# Patient Record
Sex: Male | Born: 1970 | Race: White | Hispanic: No | Smoking: Former smoker
Health system: Southern US, Community
[De-identification: ages and names within clinical notes are randomized; demographics above are authoritative.]

## PROBLEM LIST (undated history)

## (undated) DIAGNOSIS — F32A Depression, unspecified: Secondary | ICD-10-CM

## (undated) DIAGNOSIS — K429 Umbilical hernia without obstruction or gangrene: Secondary | ICD-10-CM

## (undated) DIAGNOSIS — R011 Cardiac murmur, unspecified: Secondary | ICD-10-CM

## (undated) HISTORY — DX: Cardiac murmur, unspecified: R01.1

## (undated) HISTORY — DX: Depression, unspecified: F32.A

## (undated) HISTORY — PX: HYDROCELE EXCISION / REPAIR: SUR1145

---

## 2012-11-18 ENCOUNTER — Ambulatory Visit: Payer: Self-pay | Admitting: Urology

## 2012-12-11 ENCOUNTER — Ambulatory Visit: Payer: Self-pay | Admitting: Urology

## 2014-07-02 NOTE — Op Note (Signed)
PATIENT NAME:  Jermaine Ray, Jermaine Ray MR#:  [id number] DATE OF BIRTH:  December 20, 1970  DATE OF PROCEDURE:  12/11/2012  PREOPERATIVE DIAGNOSIS: Left scrotal hematocele versus abscess.   POSTOPERATIVE DIAGNOSIS: Left scrotal abscess.   PROCEDURE: Incision and drainage of left scrotal abscess.   SURGEON: Edrick Oh, MD.   ANESTHESIA: Laryngeal mask airway anesthesia.   INDICATIONS: The patient is a 45 year old gentleman, who underwent a redo left hydrocelectomy several weeks prior. He did well initially after the procedure. He developed slight scrotal swelling and increased discomfort approximately 1 week ago. This has progressively worsened to the point of significant pain and discomfort. There has been progression of the scrotal swelling with slight skin edema and no significant erythema. This began after returning to work with increased physical activity. The initial concern was the possibility of a hematocele. He did develop a fever to 103 with subsequent resolution and no recurrence. This was more concerning for possible abscess. Due to the continued pain and discomfort and worsening symptoms, we have elected to proceed with incision and drainage for further evaluation. He presents for this purpose.   DESCRIPTION OF PROCEDURE: After informed consent was obtained, the patient was taken to the operating room and placed in the supine position on the operating table under laryngeal mask airway anesthesia. The patient was then prepped and draped in the usual standard fashion. The inferior-most aspect of the previous midline incision was opened approximately 2 cm. As the skin was opened, a large amount of thick, dark gray and blood-tinged material extruded from the opening. It was noted to be foul-smelling. A large amount of purulent material was drained. Digital manipulation then of the hemiscrotum demonstrated no significant other abnormalities. There were no isolated pockets or significant septations. The  hemiscrotum was irrigated with ample amounts of iodine and saline solution. Visual examination demonstrated a normal appearing testicle with minimal fibrinous material within the remaining hemiscrotum. No other significant abnormalities were noted. There was no extension into the cord or inguinal region. This all appears to be isolated to the hemiscrotal compartment. The right hemiscrotum was essentially normal except for some mild skin edema. There was no evidence of any skin necrosis or involvement of the skin per se. The cavity was then packed with Betadine soaked Kling. ABD pads were placed as well as a scrotal support and fluffs. The patient was then awakened from laryngeal mask airway anesthesia and was taken to the recovery room in stable condition. There were no problems or complications. The patient tolerated the procedure well. Also of note, cultures were obtained at the initial opening. We will treat with appropriate antibiotics once the culture has returned. He is otherwise to continue his current antibiotic therapy.   ____________________________ Denice Bors. Jacqlyn Larsen, MD bsc:aw D: 12/12/2012 09:01:37 ET T: 12/12/2012 09:20:56 ET JOB#: [id number]  cc: Denice Bors. Jacqlyn Larsen, MD, <Dictator> Denice Bors Seve Monette MD ELECTRONICALLY SIGNED 12/15/2012 8:59

## 2014-07-02 NOTE — Op Note (Signed)
PATIENT NAME:  Jermaine Ray, SUPPLE MR#:  [id number] DATE OF BIRTH:  1970-09-10  DATE OF PROCEDURE:  11/18/2012  PRINCIPAL DIAGNOSIS:  Recurrent left hydrocele.   POSTOPERATIVE DIAGNOSIS:  Recurrent left hydrocele.   PROCEDURE:  Redo left hydrocelectomy.   SURGEON:  Dr. Edrick Oh  ANESTHESIA:  Laryngeal mask airway anesthesia.   INDICATION:  The patient is a 44 year old gentleman who underwent a bilateral hydrocele repair a number of years prior. He recently developed progressive enlargement of the left hemiscrotum. Ultrasound confirms recurrence of a hydrocele due to its size and progressive growth. He has elected to proceed with hydrocelectomy. He presents today for this purpose.   DESCRIPTION OF PROCEDURE:  After informed consent was obtained, the patient was taken to the operating room and placed in the supine position on the operating table under laryngeal mask airway anesthesia. The patient was then prepped and draped in the usual standard fashion. A midline scrotal incision was made approximately 5 cm in length. The dissection was continued down to expose the hydrocele sac. The overlying tissue was noted to be very adherent to the hydrocele sac, but with continued manipulation it was able to be freed anteriorly with continued manipulation. The hydrocele sac was able to be delivered from the hemiscrotum. Further inspection demonstrated extensive scar and adherent tissue surrounding the testicle. The testicle itself could not be visualized. On initial examination, the cord structures could not be identified. A thinner area on the anterior aspect of the hydrocele was then opened utilizing electrocautery. Once the fluid was drained, the inside of the hydrocele sac was inspected. Approximately 250 mL of dark yellow fluid was drained from the sac. The testicle could be seen posterior with the edges of the hydrocele sac visible. A plane was able to be developed more inferior separating the sac from the  surrounding tissue. This was extended circumferentially. There were areas more anterior that were very adherent; this limited dissection to some degree, however, it was in close proximity to the testicle proper. The vessels and vas deferens were subsequently identified. The dissection was taken down to this point. This left an approximate 2 cm segment of hydrocele sac adherent which could not be easily dissected Once the hydrocele sac was dissected free to the level of the testicle along the remaining circumference, the excessive sac was cut free utilizing electrocautery. Due to the adherence of the previous repair, the remaining tissue was unable to be everted posterior to the testicle. The tissue was secured to the testicle and surrounding tissue with interrupted 2-0 chromic sutures circumferentially to prevent reinversion. Once this was completed, the testicle was replaced back into the hemiscrotum. It was noted that the inferior aspect did partially recollapse. This was in the area that the extra tissue was present. The testicle was redelivered, additional adhesion was dissected free from the posterior aspect of the testicle more toward the cord proper. Once this was completed four 3-0 Vicryl sutures were utilized to secure the edges of the residual hydrocele sac posterior to the testicle. The testicle was then returned to the hemiscrotum. It was noted to reside within good position with no areas of collapse. A few small areas of venous ooze were noted on the inner hemiscrotal tissue. These were cauterized utilizing electrocautery. Once adequate hemostasis was obtained, the left hemiscrotum was closed utilizing running 2-0 chromic suture. The skin was closed utilizing interrupted 4-0 Monocryl mattress sutures. Collodion was then applied. The scrotal support with fluffs was then placed. The patient was then  awakened from laryngeal mask airway anesthesia, was taken to the recovery room in stable condition. There  were no problems or complications. The patient tolerated the procedure well. Estimated blood loss was minimal.  The hydrocele sac was sent to pathology for pathology evaluation.   ____________________________ Denice Bors Jacqlyn Larsen, MD bsc:ce D: 11/18/2012 12:49:05 ET T: 11/18/2012 13:30:27 ET JOB#: [id number]  cc: Denice Bors. Jacqlyn Larsen, MD, <Dictator> Denice Bors Alysse Rathe MD ELECTRONICALLY SIGNED 11/18/2012 17:40

## 2016-11-29 ENCOUNTER — Emergency Department
Admission: EM | Admit: 2016-11-29 | Discharge: 2016-11-29 | Disposition: A | Discharge status: Home / Self Care | Payer: Self-pay | Attending: Emergency Medicine | Admitting: Emergency Medicine

## 2016-11-29 ENCOUNTER — Encounter: Payer: Self-pay | Admitting: Emergency Medicine

## 2016-11-29 ENCOUNTER — Emergency Department: Payer: Self-pay

## 2016-11-29 DIAGNOSIS — F17290 Nicotine dependence, other tobacco product, uncomplicated: Secondary | ICD-10-CM | POA: Insufficient documentation

## 2016-11-29 DIAGNOSIS — R0789 Other chest pain: Secondary | ICD-10-CM | POA: Insufficient documentation

## 2016-11-29 LAB — BASIC METABOLIC PANEL
ANION GAP: 6 (ref 5–15)
BUN: 16 mg/dL (ref 6–20)
CHLORIDE: 102 mmol/L (ref 101–111)
CO2: 29 mmol/L (ref 22–32)
Calcium: 9.5 mg/dL (ref 8.9–10.3)
Creatinine, Ser: 1.13 mg/dL (ref 0.61–1.24)
GFR calc non Af Amer: >60 mL/min (ref >60)
GLUCOSE: 92 mg/dL (ref 65–99)
POTASSIUM: 4.3 mmol/L (ref 3.5–5.1)
Sodium: 137 mmol/L (ref 135–145)

## 2016-11-29 LAB — CBC
HEMATOCRIT: 40.7 % (ref 40.0–52.0)
HEMOGLOBIN: 13.9 g/dL (ref 13.0–18.0)
MCH: 31.4 pg (ref 26.0–34.0)
MCHC: 34.1 g/dL (ref 32.0–36.0)
MCV: 92.1 fL (ref 80.0–100.0)
Platelets: 257 10*3/uL (ref 150–440)
RBC: 4.42 MIL/uL (ref 4.40–5.90)
RDW: 13.2 % (ref 11.5–14.5)
WBC: 10.2 10*3/uL (ref 3.8–10.6)

## 2016-11-29 LAB — TROPONIN I

## 2016-11-29 IMAGING — CR DG CHEST 2V
1 series · 2 of 2 positions shown · non-contrast
Comparison: None.

CLINICAL DATA: Left-sided chest pain radiating to left arm for
several days.

EXAM:
CHEST  2 VIEW

[Series 1: dg chest 2 view · 0.14mm/px · 2 of 2 slices shown]
[im 1/2]
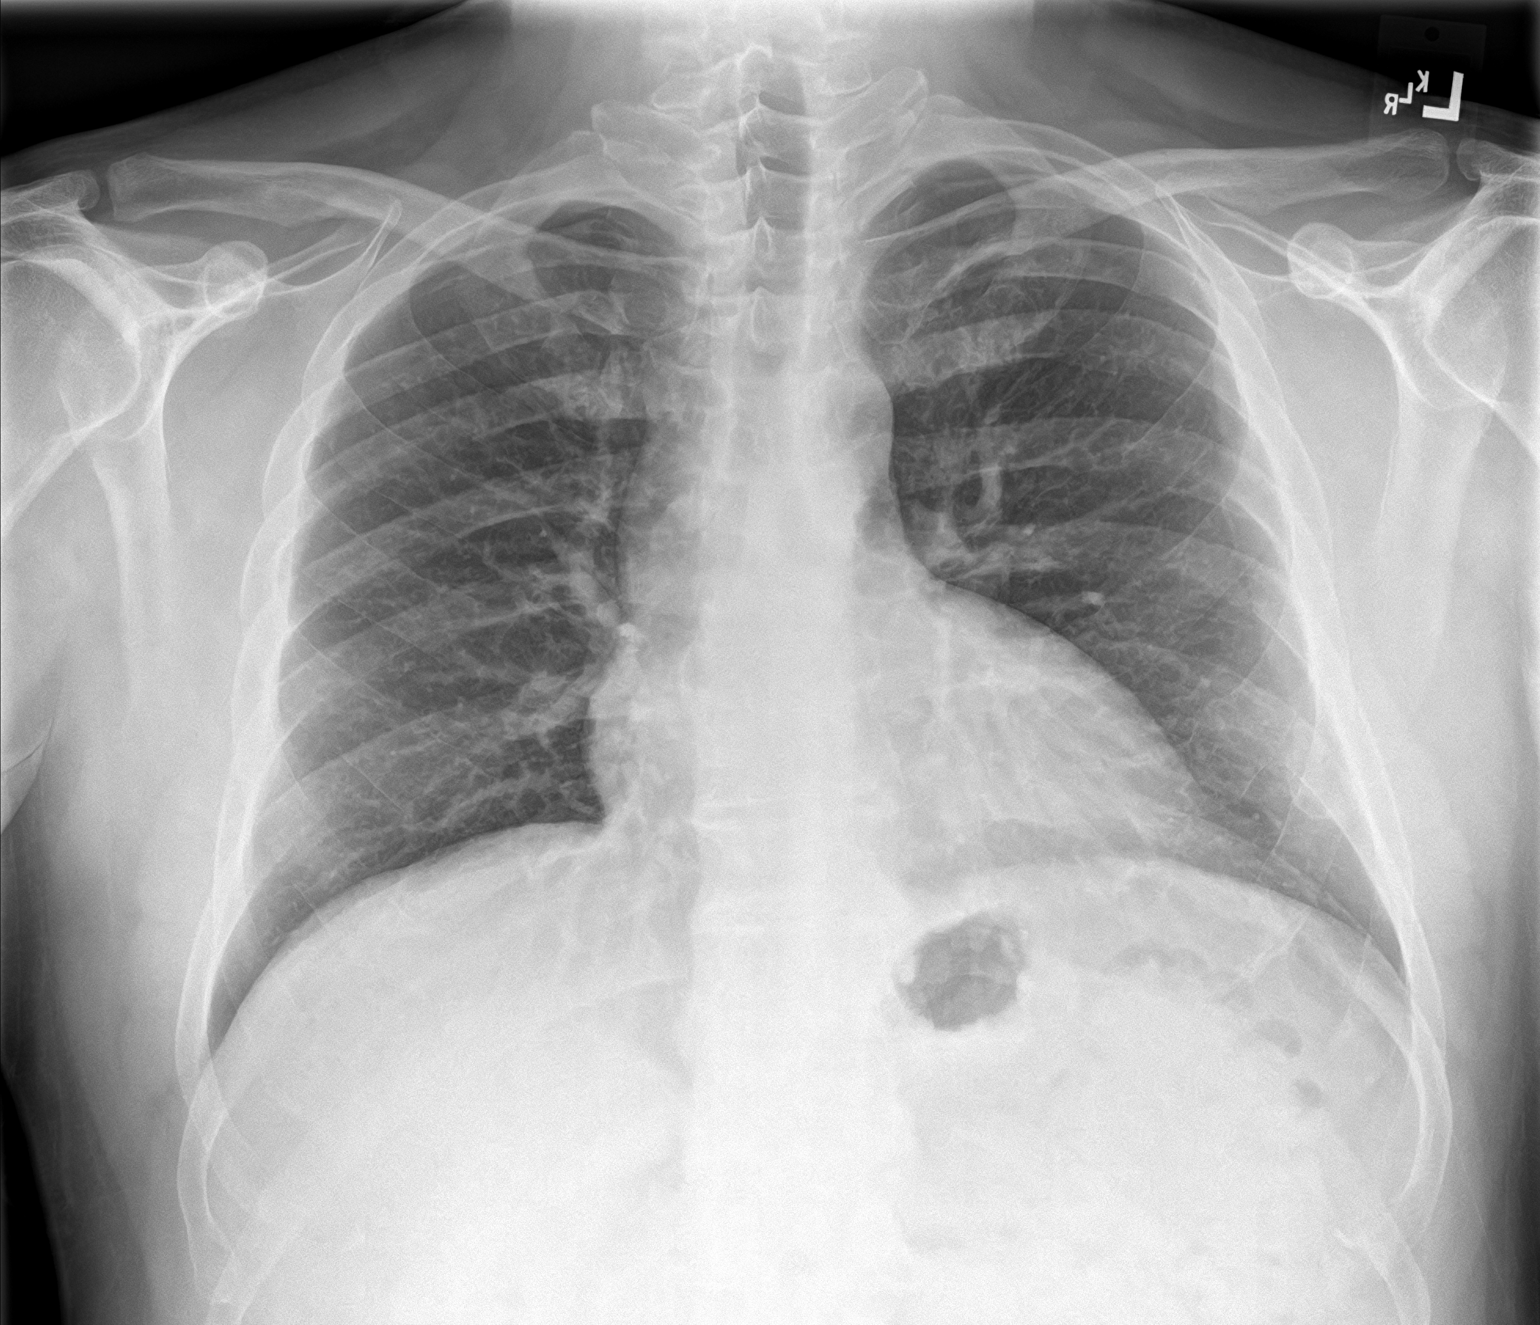
[im 2/2]
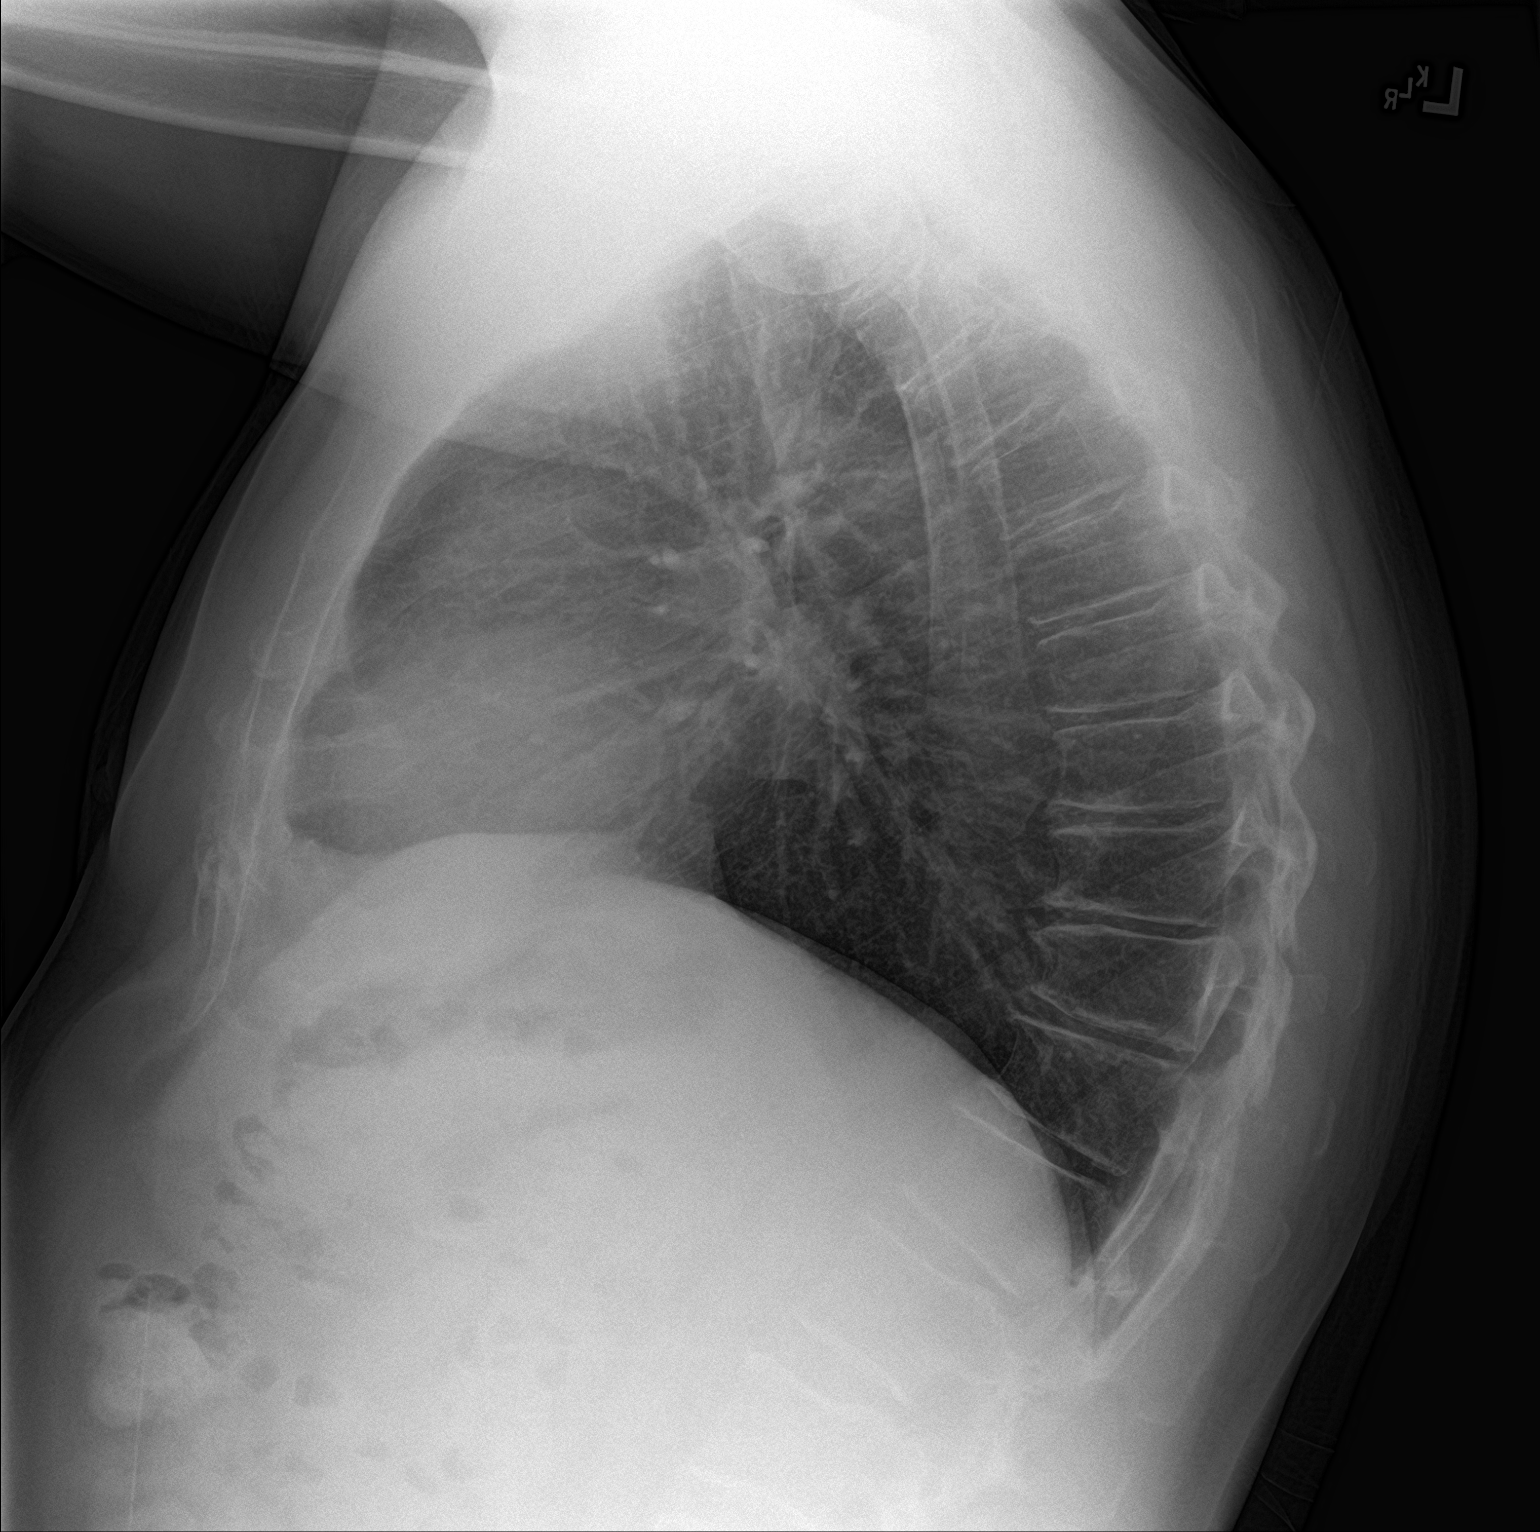

[2 of 2 positions shown; findings below may reference images not displayed]

FINDINGS: The heart size and mediastinal contours are within normal limits.
Both lungs are clear. No evidence of pneumothorax or pleural
effusion. The visualized skeletal structures are unremarkable.
IMPRESSION: No active cardiopulmonary disease.

## 2016-11-29 MED ORDER — ASPIRIN 81 MG PO CHEW
324.0000 mg | CHEWABLE_TABLET | Freq: Once | ORAL | Status: AC
  Administered 2016-11-29: 324 mg via ORAL

## 2016-11-29 MED ORDER — ASPIRIN 81 MG PO CHEW
CHEWABLE_TABLET | ORAL | Status: AC
  Filled 2016-11-29: qty 4

## 2016-11-29 NOTE — ED Provider Notes (Addendum)
[facility] Emergency Department Provider Note  ____________________________________________   First MD Initiated Contact with Patient 11/29/16 1817     (approximate)  I have reviewed the triage vital signs and the nursing notes.   HISTORY  Chief Complaint Chest Pain    HPI Jermaine Ray is a 46 y.o. male with no significant chronic medical issues except for a prior history of tobacco use and a strong family history for early onset ACS/CAD (father had MI at age 47) who presents for evaluation of about 3 days of intermittent sharp and aching left-sided chest pain that occasionally radiates into his left arm.  Nothing in particular makes the patient's symptoms better nor worse.    He currently feels mild and somewhat intermittent and he feels like it is moving between the area under his left arm and lower down on the left side of his ribs.  It is not tender to the touch on the outside and he states it feels like it is on the inside.  It is not related to exertion.  He denies shortness of breath, fever/chills, nausea, vomiting, abdominal pain, , and diarrhea.  He is mostly concerned because of his father's history. he does state that he has been lifting some heavy things at work recently and he wonders if he may be pulled a muscle but again the pain is not specifically reproducible with palpation nor ROM of LUE.    History reviewed. No pertinent past medical history.  There are no active problems to display for this patient.   Past Surgical History:  Procedure Laterality Date  . HYDROCELE EXCISION / REPAIR     x2    Prior to Admission medications   Not on File    Allergies Patient has no known allergies.  No family history on file.  Social History Social History  Substance Use Topics  . Smoking status: Former Smoker    Quit date: 11/30/2014  . Smokeless tobacco: Never Used     Comment: "vaping occasionally"  . Alcohol use Yes      Comment: occasionally    Review of Systems Constitutional: No fever/chills Eyes: No visual changes. ENT: No sore throat. Cardiovascular: chest pain as described above occasionally radiating into the left arm Respiratory: Denies shortness of breath. Gastrointestinal: No abdominal pain.  No nausea, no vomiting.  No diarrhea.  No constipation. Genitourinary: Negative for dysuria. Musculoskeletal: Negative for neck pain.  Negative for back pain. Integumentary: Negative for rash. Neurological: Negative for headaches, focal weakness or numbness.   ____________________________________________   PHYSICAL EXAM:  ED Triage Vitals  Enc Vitals Group     BP 11/29/16 1913 (!) 141/81     Pulse Rate 11/29/16 1913 60     Resp 11/29/16 1913 16     Temp 11/29/16 1913 98.7 F (37.1 C)     Temp Source 11/29/16 1913 Oral     SpO2 11/29/16 1656 99 %     Weight 11/29/16 1656 93 kg (205 lb)     Height 11/29/16 1656 1.778 m (5\' 10" )     Head Circumference --      Peak Flow --      Pain Score 11/29/16 1656 0     Pain Loc --      Pain Edu? --      Excl. in [place]? --      Constitutional: Alert and oriented. Well appearing and in no acute distress. Eyes: Conjunctivae are normal.  Head: Atraumatic.  Nose: No congestion/rhinnorhea. Mouth/Throat: Mucous membranes are moist. Neck: No stridor.  No meningeal signs.   Cardiovascular: Normal rate, regular rhythm. Good peripheral circulation. Grossly normal heart sounds. no reproducible chest wall tenderness Respiratory: Normal respiratory effort.  No retractions. Lungs CTAB. Gastrointestinal: Soft and nontender. No distention.  Musculoskeletal: No lower extremity tenderness nor edema. No gross deformities of extremities. Neurologic:  Normal speech and language. No gross focal neurologic deficits are appreciated.  Skin:  Skin is warm, dry and intact. No rash noted. Psychiatric: Mood and affect are normal. Speech and behavior are  normal.  ____________________________________________   LABS (all labs ordered are listed, but only abnormal results are displayed)  Labs Reviewed  BASIC METABOLIC PANEL  CBC  TROPONIN I   ____________________________________________  EKG  ED ECG REPORT I, Dairon Procter, the attending physician, personally viewed and interpreted this ECG.  Date: 11/29/2016 EKG Time: 16:55 Rate: 74 Rhythm: normal sinus rhythm QRS Axis: normal Intervals: normal except for borderline LVH ST/T Wave abnormalities: inverted T waves in lead 3 and slightly pronounced T waves in V2, otherwise unremarkable Narrative Interpretation: no evidence of acute ischemia  ____________________________________________  RADIOLOGY   Dg Chest 2 View  Result Date: 11/29/2016 CLINICAL DATA:  Left-sided chest pain radiating to left arm for several days. EXAM: CHEST  2 VIEW COMPARISON:  None. FINDINGS: The heart size and mediastinal contours are within normal limits. Both lungs are clear. No evidence of pneumothorax or pleural effusion. The visualized skeletal structures are unremarkable. IMPRESSION: No active cardiopulmonary disease. Electronically Signed   By: Earle Gell M.D.   On: 11/29/2016 17:55    ____________________________________________   PROCEDURES  Critical Care performed: No   Procedure(s) performed:   Procedures   ____________________________________________   INITIAL IMPRESSION / ASSESSMENT AND PLAN / ED COURSE  Pertinent labs & imaging results that were available during my care of the patient were reviewed by me and considered in my medical decision making (see chart for details).  Differential diagnosis includes, but is not limited to, ACS, aortic dissection, pulmonary embolism, cardiac tamponade, pneumothorax, pneumonia, pericarditis/myocarditis, and musculoskeletal chest wall pain.  however, I reviewed his lab work and it is all within normal limits.  His chest x-ray is unremarkable  and his EKG shows no evidence of ischemia. He is low risk by HEART score (3) and PERC negative.  He has been having symptoms for 3 days so there is no indication for repeat troponin.  I gave him a full dose aspirin and encouraged him to follow up at the next available opportunity with an outpatient cardiologist.  I am giving him follow-up information.  I do not think he has no acute or emergent medical condition at this time but he does need follow-up possibly for a stress test or further cardiac evaluation.  He understands and agrees with the plan.  I gave my usual and customary return precautions.       Clinical Course as of Nov 30 2311  Thu Nov 29, 2016  1913 Of note there is no relevant prior history in the electronic medical record to review  [CF]    Clinical Course User Index [CF] Hinda Kehr, MD    ____________________________________________  FINAL CLINICAL IMPRESSION(S) / ED DIAGNOSES  Final diagnoses:  Atypical chest pain     MEDICATIONS GIVEN DURING THIS VISIT:  Medications  aspirin chewable tablet 324 mg (324 mg Oral Given 11/29/16 1857)     NEW OUTPATIENT MEDICATIONS STARTED DURING THIS VISIT:  There are  no discharge medications for this patient.   There are no discharge medications for this patient.   There are no discharge medications for this patient.    Note:  This document was prepared using Dragon voice recognition software and may include unintentional dictation errors.    Hinda Kehr, MD 11/29/16 Nathen May    Hinda Kehr, MD 11/29/16 [phone]

## 2016-11-29 NOTE — ED Triage Notes (Signed)
Patient presents to the ED with occasional chest pain x 2-3 days.  Patient states pain is in his left lower chest/rib area and occasionally radiating to his left arm.  Patient is in no obvious distress at this time.

## 2016-11-29 NOTE — Discharge Instructions (Signed)

## 2016-11-29 NOTE — ED Notes (Signed)
Pt c/o intermit CP xcouple days , worse at night. Pt states hx of anxiety, ambulatory, NAD noted at this time

## 2016-11-29 NOTE — ED Notes (Signed)
Signature pad not working. Patient verbalized understanding of discharge instructions and follow-up care. Ambulatory to lobby with NAD.

## 2016-11-29 NOTE — ED Notes (Signed)
Called x3 for pt in lobby at this time with no answer

## 2016-11-30 ENCOUNTER — Telehealth: Payer: Self-pay

## 2016-11-30 NOTE — Telephone Encounter (Signed)
Lmov for patient to call back They were seen in ED on 11/30/16 for CP Will try again at a later time

## 2016-12-05 NOTE — Telephone Encounter (Signed)
Lmov for patient to call back They were seen in ED on 11/30/16 for CP Will try again at a later time

## 2016-12-10 NOTE — Telephone Encounter (Signed)
Lmov for patient to call back They were seen in ED on 11/30/16 for CP Will try again at a later time

## 2018-09-17 ENCOUNTER — Ambulatory Visit: Payer: Self-pay | Admitting: Surgery

## 2018-09-17 NOTE — H&P (Signed)
Subjective:   CC: Umbilical hernia without obstruction and without gangrene [K42.9]  HPI:  Jermaine Ray is a 48 y.o. male who was referred by Lovie Macadamia, MD for evaluation of above. Symptoms were first noted a few years ago. Pain is dull, intermittent and discomfort, confined to the umbilicus, without radiation.  Associated with nothing specific, exacerbated by palpation.  Lump is reducible. Patient has no symptoms of  difficulty urinating.    Past Medical History:  has a past medical history of Chickenpox and Migraines.  Past Surgical History:  Past Surgical History:  Procedure Laterality Date  . HYDROCELE EXCISION / REPAIR      Family History: family history includes Alcohol abuse in his maternal grandfather; Cancer in his paternal grandfather and paternal grandmother; Myocardial Infarction (Heart attack) (age of onset: 105) in his father; Osteoporosis (Thinning of bones) in his maternal grandfather and maternal grandmother; Stroke in his maternal grandmother.  Social History:  reports that he quit smoking about 3 years ago. He smoked 1.00 pack per day. He has never used smokeless tobacco. He reports current alcohol use. He reports that he does not use drugs.  Current Medications: currently has no medications in their medication list.  Allergies:  Allergies as of 09/17/2018  . (No Known Allergies)    ROS:  A 15 point review of systems was performed and pertinent positives and negatives noted in HPI   Objective:     BP 127/81   Pulse 81   Ht 177.8 cm (5\' 10" )   Wt 93.9 kg (207 lb)   BMI 29.70 kg/m   Constitutional :  alert, appears stated age, cooperative and no distress  Lymphatics/Throat:  no asymmetry, masses, or scars  Respiratory:  clear to auscultation bilaterally  Cardiovascular:  regular rate and rhythm  Gastrointestinal: soft, non-tender; bowel sounds normal; no masses,  no organomegaly. umbilical hernia noted.  small and reducible  Musculoskeletal:  Steady gait and movement  Skin: Cool and moist, no visible surgical scars   Psychiatric: Normal affect, non-agitated, not confused       LABS:  n/a   RADS: n/a Assessment:       Umbilical hernia without obstruction and without gangrene [K42.9]  Plan:     1. Umbilical hernia without obstruction and without gangrene [K42.9]   Discussed the risk of surgery including recurrence, which can be up to 50% in the case of incisional or complex hernias, possible use of prosthetic materials (mesh) and the increased risk of mesh infxn if used, bleeding, chronic pain, post-op infxn, post-op SBO or ileus, and possible re-operation to address said risks. The risks of general anesthetic, if used, includes MI, CVA, sudden death or even reaction to anesthetic medications also discussed. Alternatives include continued observation.  Benefits include possible symptom relief, prevention of incarceration, strangulation, enlargement in size over time, and the risk of emergency surgery in the face of strangulation.   Typical post-op recovery time of 3-5 days with 4-6 weeks of activity restrictions were also discussed.  ED return precautions given for sudden increase in pain, size of hernia with accompanying fever, nausea, and/or vomiting.  The patient verbalized understanding and all questions were answered to the patient's satisfaction.   2. Patient has elected to proceed with surgical treatment.  Will proceed with open in hopes of only needing primary repair, since he voiced some concern about using mesh Procedure will be scheduled.  Written consent was obtained.

## 2018-09-18 ENCOUNTER — Inpatient Hospital Stay: Admission: RE | Admit: 2018-09-18 | Payer: Self-pay | Source: Ambulatory Visit

## 2018-09-19 ENCOUNTER — Encounter: Admission: RE | Admit: 2018-09-19 | Payer: Self-pay | Source: Ambulatory Visit

## 2018-09-22 ENCOUNTER — Other Ambulatory Visit: Payer: Self-pay

## 2018-09-25 ENCOUNTER — Ambulatory Visit: Admit: 2018-09-25 | Payer: Self-pay | Admitting: Surgery

## 2018-09-25 SURGERY — REPAIR, HERNIA, UMBILICAL, ADULT
Anesthesia: General

## 2018-11-03 ENCOUNTER — Other Ambulatory Visit: Payer: Self-pay | Admitting: Sports Medicine

## 2018-11-03 DIAGNOSIS — G8929 Other chronic pain: Secondary | ICD-10-CM

## 2018-11-12 ENCOUNTER — Ambulatory Visit: Payer: PRIVATE HEALTH INSURANCE

## 2018-11-24 ENCOUNTER — Ambulatory Visit
Admission: RE | Admit: 2018-11-24 | Discharge: 2018-11-24 | Disposition: A | Discharge status: Home / Self Care | Payer: PRIVATE HEALTH INSURANCE | Source: Ambulatory Visit | Attending: Sports Medicine | Admitting: Sports Medicine

## 2018-11-24 ENCOUNTER — Other Ambulatory Visit: Payer: Self-pay

## 2018-11-24 DIAGNOSIS — M25562 Pain in left knee: Secondary | ICD-10-CM | POA: Diagnosis not present

## 2018-11-24 DIAGNOSIS — G8929 Other chronic pain: Secondary | ICD-10-CM | POA: Insufficient documentation

## 2018-11-24 IMAGING — MR MR KNEE*L* W/O CM
7 series · 40 of 40 positions shown · non-contrast
Comparison: None.

CLINICAL DATA: Chronic knee pain for 10 years, posterior and
lateral

EXAM:
MRI OF THE LEFT KNEE WITHOUT CONTRAST
TECHNIQUE: Multiplanar, multisequence MR imaging of the knee was performed. No
intravenous contrast was administered.

[Series 8: T2 fat-sat · axial · left · 4.0mm · 0.50mm/px · z∈[-78,+46]mm · 5 of 26 slices shown (1 of 3)]
[im 1/26]
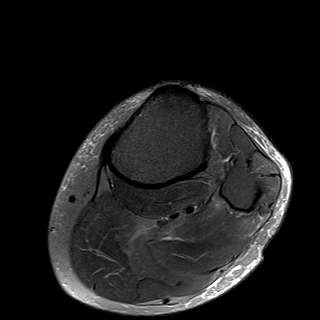
[im 7/26]
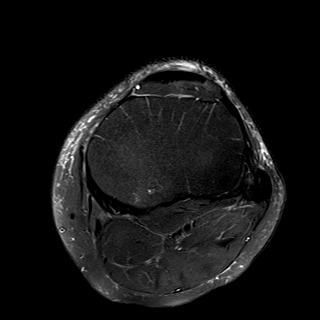
[im 13/26]
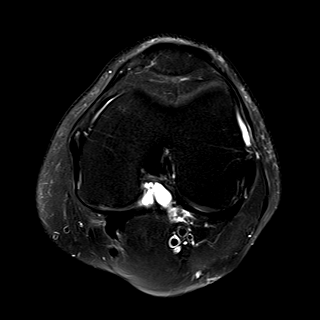
[im 19/26]
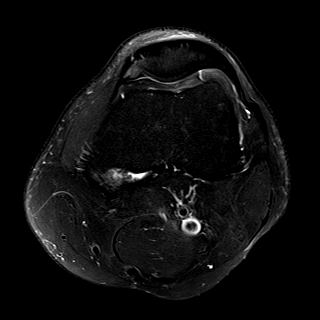
[im 26/26]
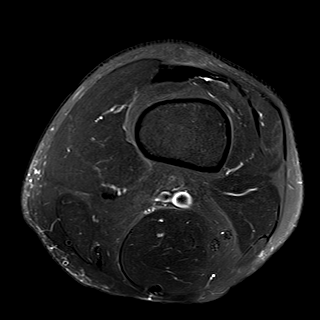

[Series 9: T1 · coronal · left · 4.0mm · 0.42mm/px · 6 of 32 slices shown]
[im 1/32]
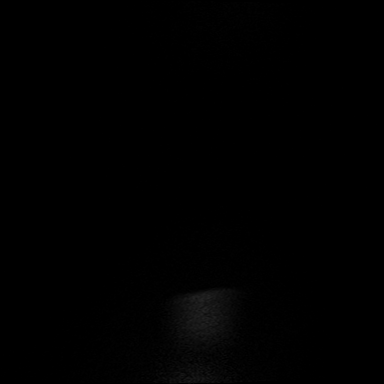
[im 7/32]
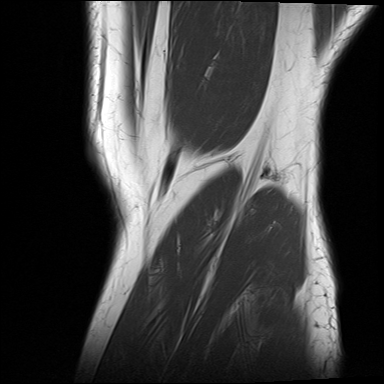
[im 13/32]
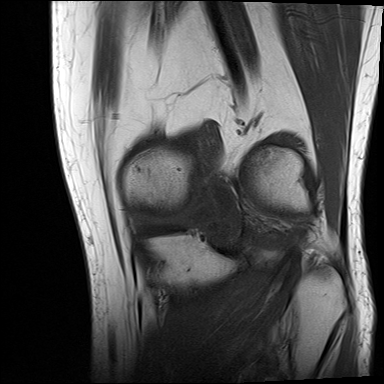
[im 19/32]
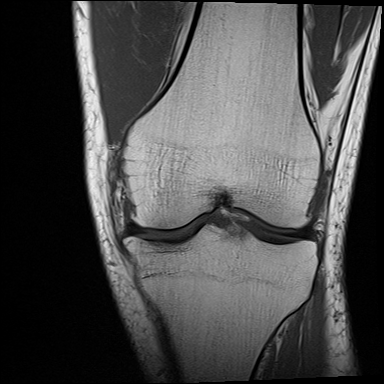
[im 25/32]
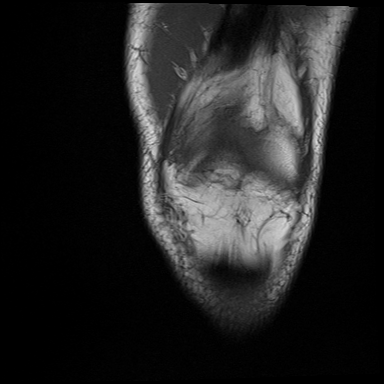
[im 32/32]
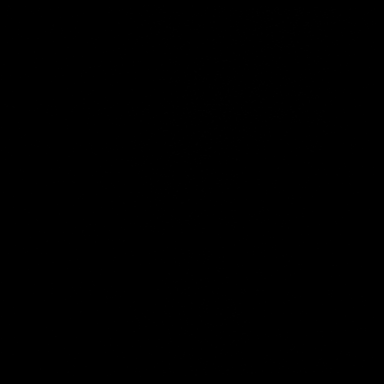

[Series 10: T2 fat-sat · coronal · left · 4.0mm · 0.59mm/px · 6 of 32 slices shown (2 of 3)]
[im 1/32]
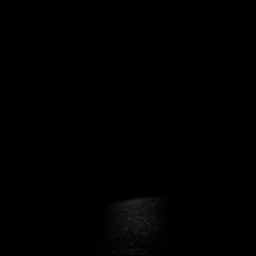
[im 7/32]
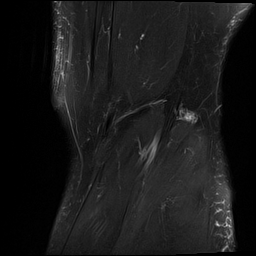
[im 13/32]
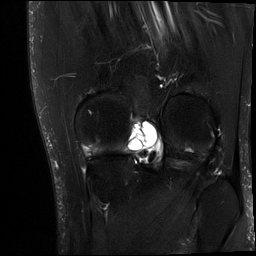
[im 19/32]
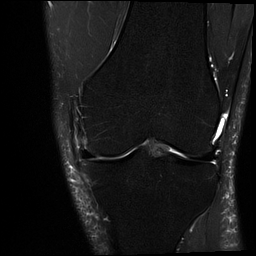
[im 25/32]
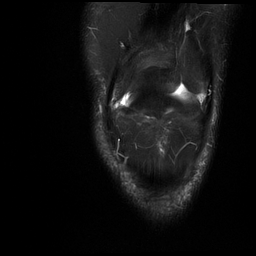
[im 32/32]
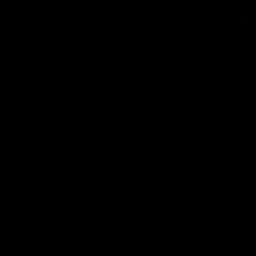

[Series 11: PD fat-sat · coronal · left · 4.0mm · 0.59mm/px · 6 of 32 slices shown (1 of 2)]
[im 1/32]
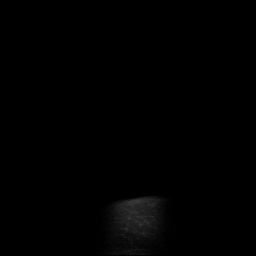
[im 7/32]
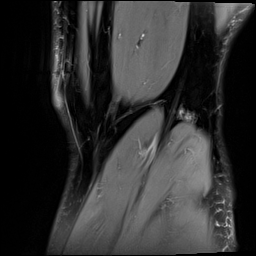
[im 13/32]
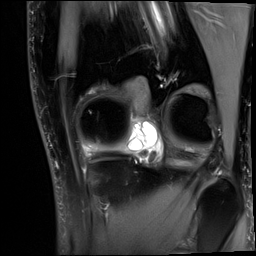
[im 19/32]
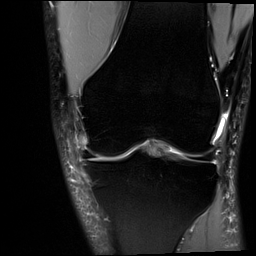
[im 25/32]
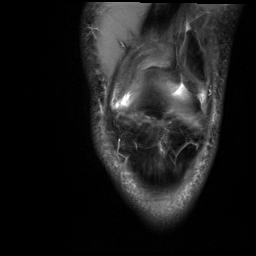
[im 32/32]
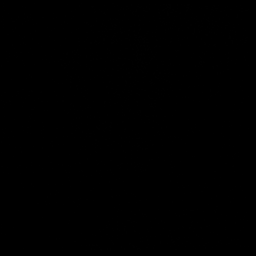

[Series 12: PD fat-sat · sagittal · left · 3.0mm · 0.59mm/px · 7 of 36 slices shown (2 of 2)]
[im 1/36]
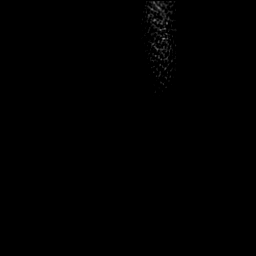
[im 6/36]
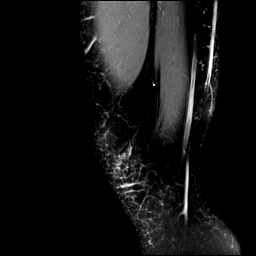
[im 12/36]
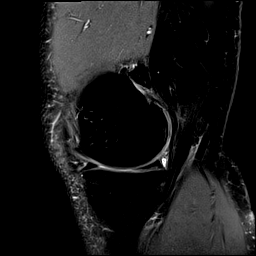
[im 18/36]
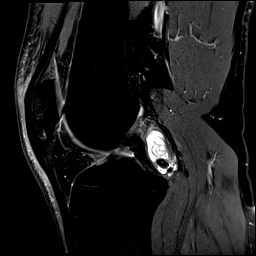
[im 24/36]
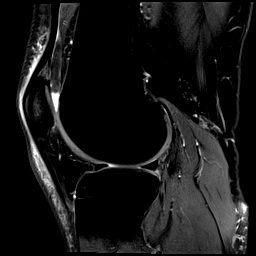
[im 30/36]
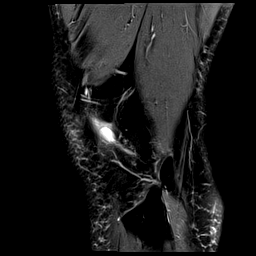
[im 36/36]
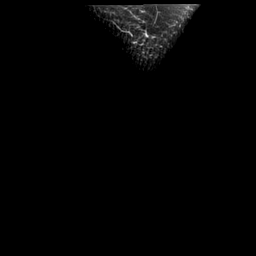

[Series 13: T2 fat-sat · sagittal · left · 3.0mm · 0.59mm/px · 7 of 36 slices shown (3 of 3)]
[im 1/36]
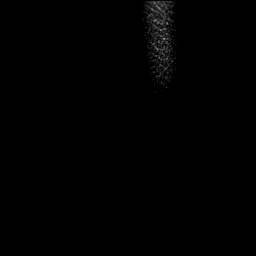
[im 6/36]
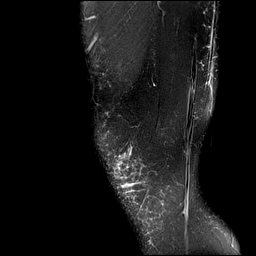
[im 12/36]
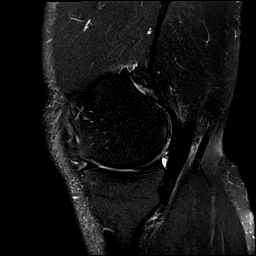
[im 18/36]
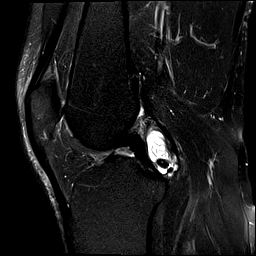
[im 24/36]
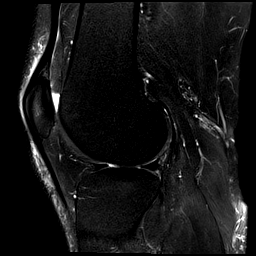
[im 30/36]
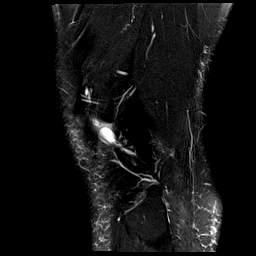
[im 36/36]
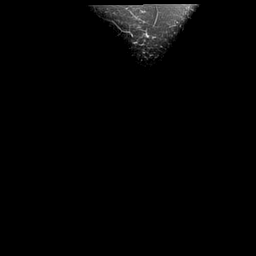

[Series 14: PD · oblique · left · 2.0mm · 0.47mm/px · 3 of 16 slices shown]
[im 1/16]
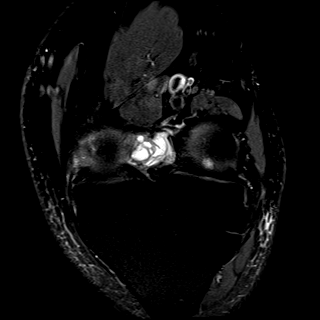
[im 8/16]
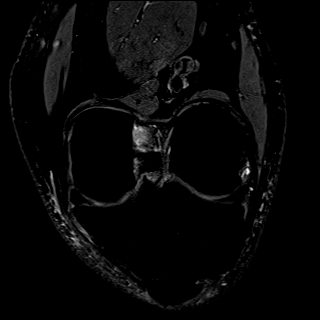
[im 16/16]
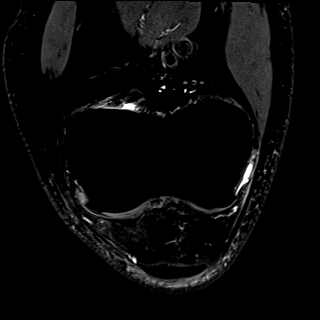

[40 of 40 positions shown; findings below may reference images not displayed]

FINDINGS: MENISCI

Medial: There is heterogeneous signal with a nondisplaced horizontal
longitudinal tear seen of the posterior horn of the medial meniscus.
Posterior to the medial meniscus there is a T2 bright lobular cystic
lesion seen extending into the posterior soft tissues. The lesion
measures 2.2 x 1.5 x 3.4 cm, likely represents a multilocular
paralabral cyst.

Lateral: Intact.

LIGAMENTS

Cruciates: The ACL is intact. The PCL is intact.

Collaterals: The MCL is intact. The lateral collateral ligamentous
complex is intact.

CARTILAGE

Patellofemoral: Mild chondral fissuring seen within the central
patellar apex.

Medial compartment: Mild chondral fissuring seen the weight-bearing
surface of the medial femoral condyle medial tibial plateau.

Lateral compartment: There is a focal chondral defect seen in the
weight-bearing surface of the lateral femoral condyle measuring 5 mm
and length.

BONES: No fracture. No avascular necrosis. No pathologic marrow
infiltration.

JOINT: No joint effusion. Edema seen within Hoffa's fat pad.

EXTENSOR MECHANISM: The patellar and quadriceps tendon are intact.
The retinaculum is unremarkable.

POPLITEAL FOSSA: No popliteal cyst.

OTHER: Posterior to the PCL within or just adjacent to the
multilocular cyst there are 2 T1/T2 isointense loose bodies the
largest measuring 7 mm. Mild prepatellar subcutaneous edema.
IMPRESSION: 1. Nondisplaced tear of the posterior medial meniscus with a large
paralabral cyst/ganglion cyst extending into the posterior medial
knee measuring 2.2 x 1.5 x 3.4 cm.
2. Intact cruciate ligaments
3. Mild tricompartmental osteoarthritis with a focal chondral defect
in the weight-bearing surface of the lateral femoral condyle
measuring 5 mm.
4. Either within or adjacent to the multilocular cyst scattered
loose bodies, the largest measuring 7 mm.

## 2019-01-17 DIAGNOSIS — Z87891 Personal history of nicotine dependence: Secondary | ICD-10-CM | POA: Insufficient documentation

## 2019-01-17 DIAGNOSIS — K5732 Diverticulitis of large intestine without perforation or abscess without bleeding: Secondary | ICD-10-CM | POA: Diagnosis not present

## 2019-01-17 DIAGNOSIS — R109 Unspecified abdominal pain: Secondary | ICD-10-CM | POA: Diagnosis present

## 2019-01-18 ENCOUNTER — Other Ambulatory Visit: Payer: Self-pay

## 2019-01-18 ENCOUNTER — Emergency Department: Payer: PRIVATE HEALTH INSURANCE

## 2019-01-18 ENCOUNTER — Emergency Department
Admission: EM | Admit: 2019-01-18 | Discharge: 2019-01-18 | Disposition: A | Discharge status: Home / Self Care | Payer: PRIVATE HEALTH INSURANCE | Attending: Emergency Medicine | Admitting: Emergency Medicine

## 2019-01-18 DIAGNOSIS — K5732 Diverticulitis of large intestine without perforation or abscess without bleeding: Secondary | ICD-10-CM

## 2019-01-18 LAB — CBC WITH DIFFERENTIAL/PLATELET
Abs Immature Granulocytes: 0.1 10*3/uL — ABNORMAL HIGH (ref 0.00–0.07)
Basophils Absolute: 0.1 10*3/uL (ref 0.0–0.1)
Basophils Relative: 0 %
Eosinophils Absolute: 0.1 10*3/uL (ref 0.0–0.5)
Eosinophils Relative: 0 %
HCT: 41.6 % (ref 39.0–52.0)
Hemoglobin: 13.3 g/dL (ref 13.0–17.0)
Immature Granulocytes: 0 %
Lymphocytes Relative: 6 %
Lymphs Abs: 1.4 10*3/uL (ref 0.7–4.0)
MCH: 30.1 pg (ref 26.0–34.0)
MCHC: 32 g/dL (ref 30.0–36.0)
MCV: 94.1 fL (ref 80.0–100.0)
Monocytes Absolute: 0.8 10*3/uL (ref 0.1–1.0)
Monocytes Relative: 4 %
Neutro Abs: 21.5 10*3/uL — ABNORMAL HIGH (ref 1.7–7.7)
Neutrophils Relative %: 90 %
Platelets: 374 10*3/uL (ref 150–400)
RBC: 4.42 MIL/uL (ref 4.22–5.81)
RDW: 12.2 % (ref 11.5–15.5)
WBC: 24 10*3/uL — ABNORMAL HIGH (ref 4.0–10.5)
nRBC: 0 % (ref 0.0–0.2)

## 2019-01-18 LAB — COMPREHENSIVE METABOLIC PANEL
ALT: 20 U/L (ref 0–44)
AST: 16 U/L (ref 15–41)
Albumin: 4.6 g/dL (ref 3.5–5.0)
Alkaline Phosphatase: 53 U/L (ref 38–126)
Anion gap: 10 (ref 5–15)
BUN: 17 mg/dL (ref 6–20)
CO2: 29 mmol/L (ref 22–32)
Calcium: 9.3 mg/dL (ref 8.9–10.3)
Chloride: 100 mmol/L (ref 98–111)
Creatinine, Ser: 1.3 mg/dL — ABNORMAL HIGH (ref 0.61–1.24)
GFR calc Af Amer: >60 mL/min (ref >60)
GFR calc non Af Amer: >60 mL/min (ref >60)
Glucose, Bld: 136 mg/dL — ABNORMAL HIGH (ref 70–99)
Potassium: 4.2 mmol/L (ref 3.5–5.1)
Sodium: 139 mmol/L (ref 135–145)
Total Bilirubin: 0.6 mg/dL (ref 0.3–1.2)
Total Protein: 8.2 g/dL — ABNORMAL HIGH (ref 6.5–8.1)

## 2019-01-18 LAB — LIPASE, BLOOD: Lipase: 21 U/L (ref 11–51)

## 2019-01-18 LAB — LACTIC ACID, PLASMA: Lactic Acid, Venous: 0.9 mmol/L (ref 0.5–1.9)

## 2019-01-18 IMAGING — CT CT ABD-PELV W/ CM
2 of 5 series · 15 of 46 positions shown, 17 images · IV contrast (APPLIED)
Comparison: None.

CLINICAL DATA: Abdominal pain.  Diarrhea.

EXAM:
CT ABDOMEN AND PELVIS WITH CONTRAST
TECHNIQUE: Multidetector CT imaging of the abdomen and pelvis was performed
using the standard protocol following bolus administration of
intravenous contrast.
CONTRAST:  100mL OMNIPAQUE IOHEXOL 300 MG/ML  SOLN

[Series 2: routine abd/pel with · axial · 0.79mm/px · z∈[-619,-159]mm · 12 of 104 slices shown, 14 images]
[im 6/104  soft-tissue]
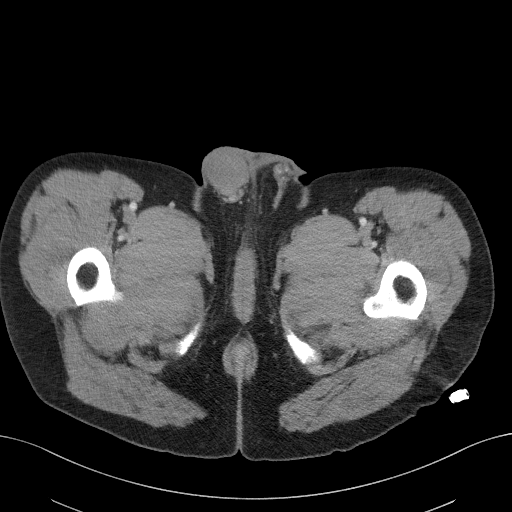
[im 6/104  bone]
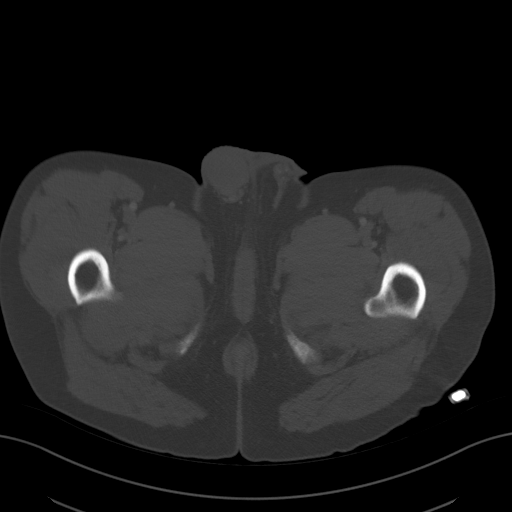
[im 16/104  soft-tissue]
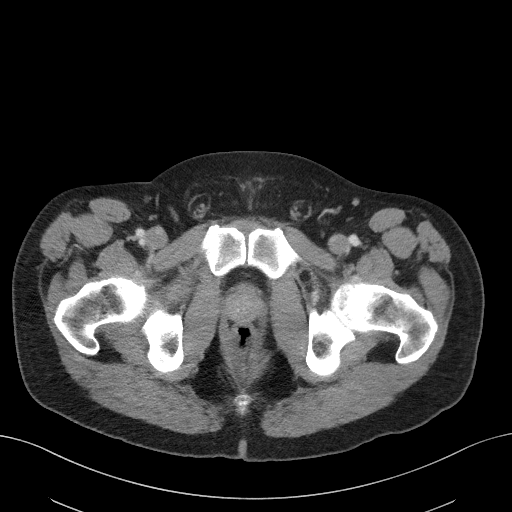
[im 21/104  soft-tissue]
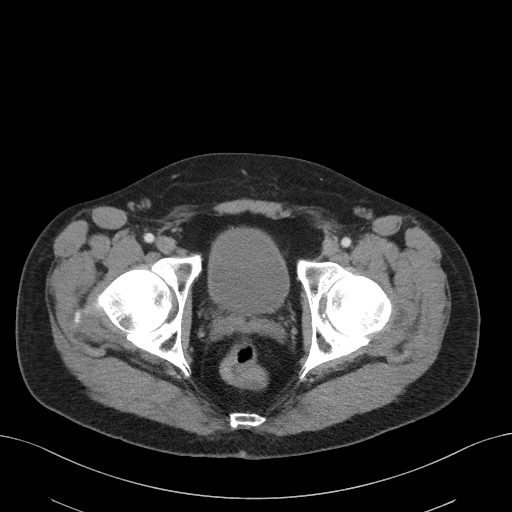
[im 31/104  soft-tissue]
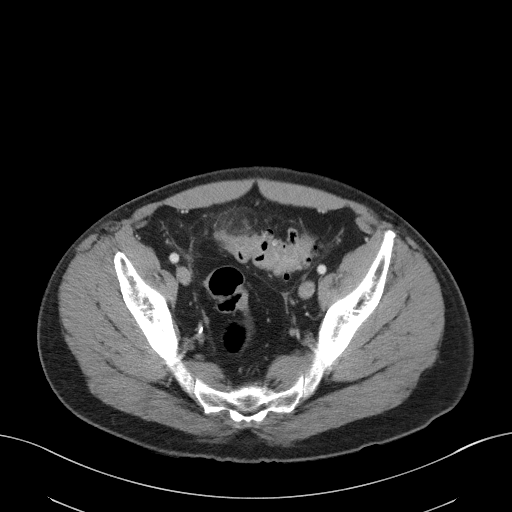
[im 42/104  soft-tissue]
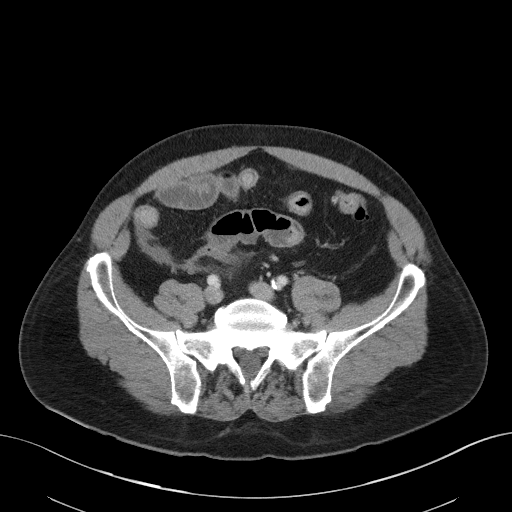
[im 47/104  soft-tissue]
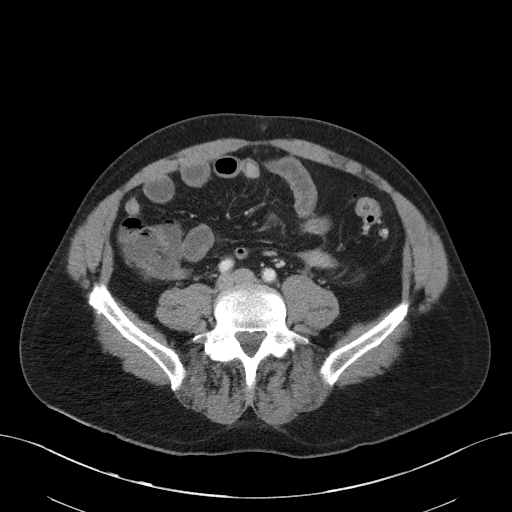
[im 57/104  soft-tissue]
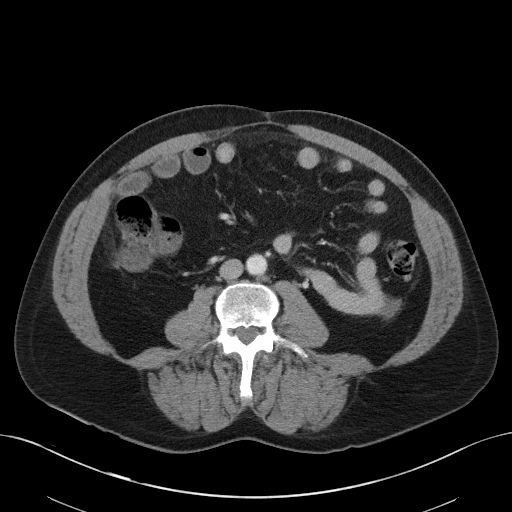
[im 62/104  soft-tissue]
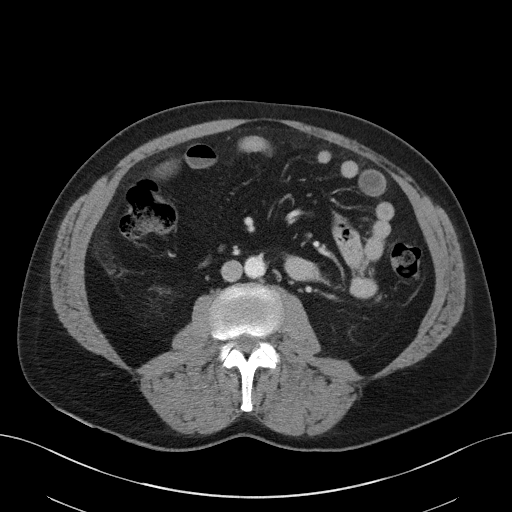
[im 73/104  soft-tissue]
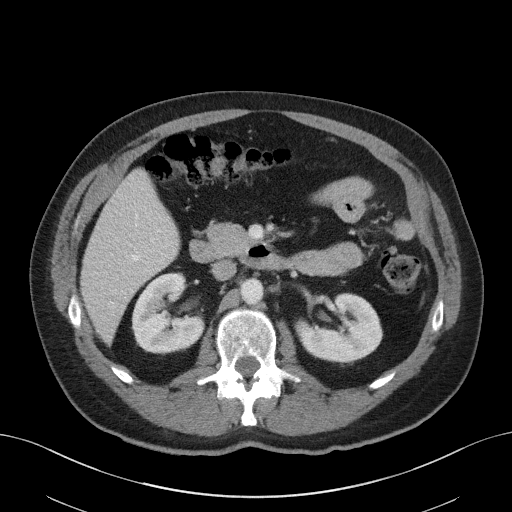
[im 73/104  bone]
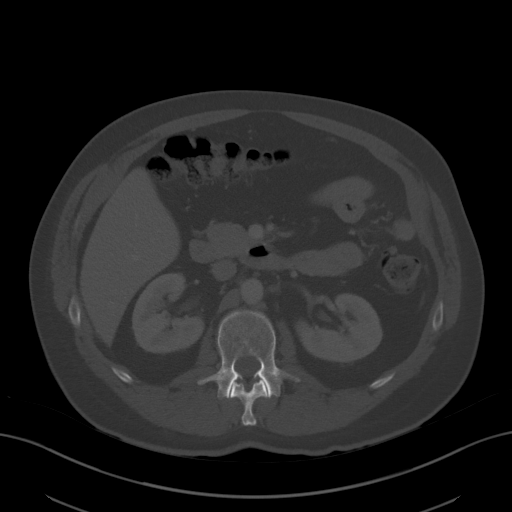
[im 83/104  soft-tissue]
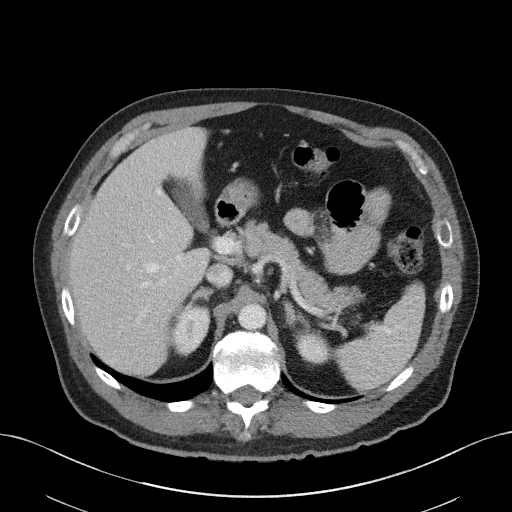
[im 88/104  soft-tissue]
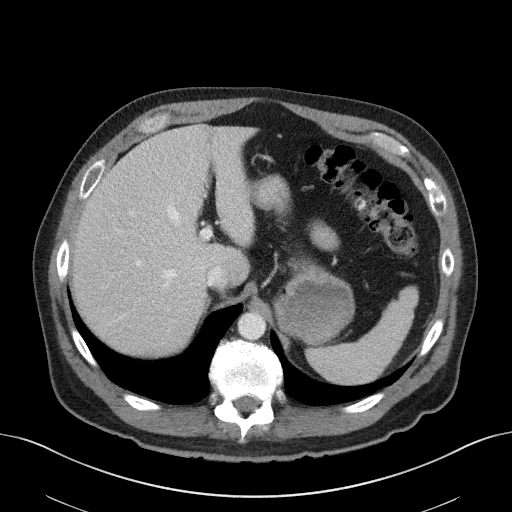
[im 98/104  soft-tissue]
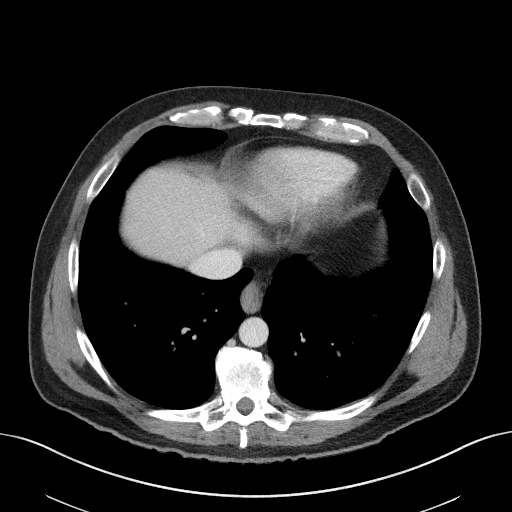

[Series 5: coronal st · coronal · 0.73mm/px · 3 of 94 slices shown]
[im 32/94  soft-tissue]
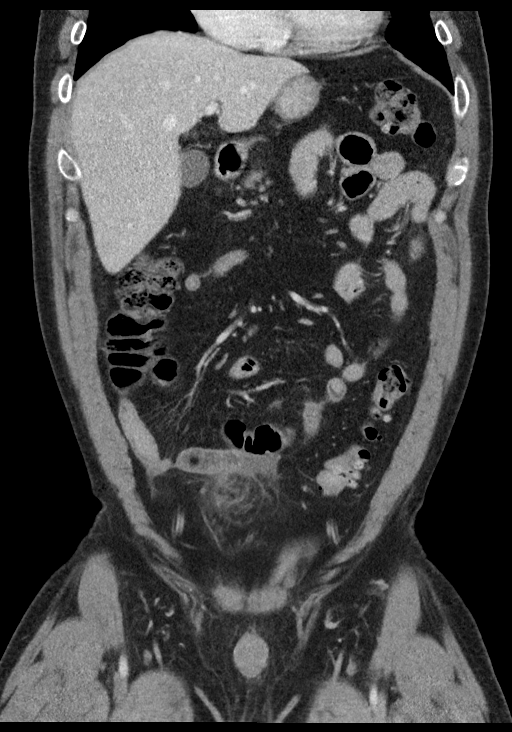
[im 42/94  soft-tissue]
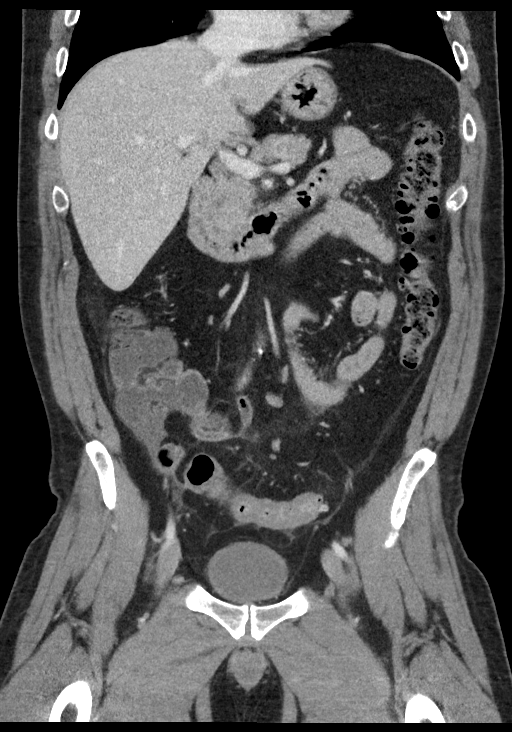
[im 52/94  soft-tissue]
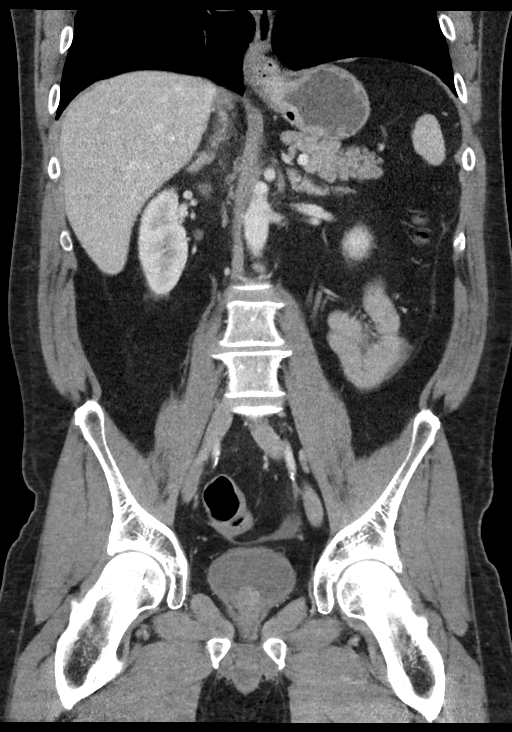

[15 of 46 positions shown; findings below may reference images not displayed]

FINDINGS: Lower chest: The lung bases are clear. The heart size is normal.

Hepatobiliary: The liver is normal. Normal gallbladder.There is no
biliary ductal dilation.

Pancreas: Normal contours without ductal dilatation. No
peripancreatic fluid collection.

Spleen: No splenic laceration or hematoma.

Adrenals/Urinary Tract:

--Adrenal glands: No adrenal hemorrhage.

--Right kidney/ureter: No hydronephrosis or perinephric hematoma.

--Left kidney/ureter: No hydronephrosis or perinephric hematoma.

--Urinary bladder: Unremarkable.

Stomach/Bowel:

--Stomach/Duodenum: There is a small hiatal hernia.

--Small bowel: There are mildly dilated loops of small bowel
scattered throughout the abdomen likely representing a reactive
ileus

--Colon: There is sigmoid diverticulitis without evidence for
associated abscess. There is sigmoid diverticulosis. There is no
free air.

--Appendix: Normal.

Vascular/Lymphatic: Atherosclerotic calcification is present within
the non-aneurysmal abdominal aorta, without hemodynamically
significant stenosis.

--No retroperitoneal lymphadenopathy.

--No mesenteric lymphadenopathy.

--No pelvic or inguinal lymphadenopathy.

Reproductive: Unremarkable

Other: There is a small amount of reactive free fluid in the right
lower quadrant. There is infiltration of the omentum without
hyperenhancement or thickening of the peritoneum. There is a small
fat containing periumbilical hernia.

Musculoskeletal. No acute displaced fractures.
IMPRESSION: 1. Acute sigmoid diverticulitis.  No abscess formation or free air.
2. Mild infiltration of the omentum raises concern for developing
early peritonitis.
3. Low-grade small-bowel ileus.
4.  Aortic Atherosclerosis ([id number]).

## 2019-01-18 MED ORDER — METRONIDAZOLE 500 MG PO TABS: 500.0000 mg | ORAL_TABLET | Freq: Three times a day (TID) | ORAL | 0 refills | Status: AC

## 2019-01-18 MED ORDER — ONDANSETRON HCL 4 MG/2ML IJ SOLN
4.0000 mg | Freq: Once | INTRAMUSCULAR | Status: AC
  Administered 2019-01-18: 4 mg via INTRAVENOUS
  Filled 2019-01-18: qty 2

## 2019-01-18 MED ORDER — IOHEXOL 300 MG/ML  SOLN
100.0000 mL | Freq: Once | INTRAMUSCULAR | Status: AC | PRN
  Administered 2019-01-18: 100 mL via INTRAVENOUS

## 2019-01-18 MED ORDER — PIPERACILLIN-TAZOBACTAM 3.375 G IVPB 30 MIN
3.3750 g | Freq: Once | INTRAVENOUS | Status: AC
  Administered 2019-01-18: 3.375 g via INTRAVENOUS
  Filled 2019-01-18: qty 50

## 2019-01-18 MED ORDER — OXYCODONE HCL 5 MG PO TABS
5.0000 mg | ORAL_TABLET | Freq: Once | ORAL | Status: AC
Start: 2019-01-18 — End: 2019-01-18
  Administered 2019-01-18: 5 mg via ORAL
  Filled 2019-01-18: qty 1

## 2019-01-18 MED ORDER — AMOXICILLIN-POT CLAVULANATE 875-125 MG PO TABS: 1.0000 | ORAL_TABLET | Freq: Two times a day (BID) | ORAL | 0 refills | Status: AC

## 2019-01-18 MED ORDER — MORPHINE SULFATE (PF) 4 MG/ML IV SOLN
4.0000 mg | Freq: Once | INTRAVENOUS | Status: AC
  Administered 2019-01-18: 4 mg via INTRAVENOUS
  Filled 2019-01-18: qty 1

## 2019-01-18 MED ORDER — OXYCODONE-ACETAMINOPHEN 5-325 MG PO TABS: 1.0000 | ORAL_TABLET | ORAL | 0 refills | Status: DC | PRN

## 2019-01-18 MED ORDER — ACETAMINOPHEN 500 MG PO TABS
1000.0000 mg | ORAL_TABLET | Freq: Once | ORAL | Status: AC
Start: 2019-01-18 — End: 2019-01-18
  Administered 2019-01-18: 1000 mg via ORAL
  Filled 2019-01-18: qty 2

## 2019-01-18 MED ORDER — ONDANSETRON 4 MG PO TBDP: 4.0000 mg | ORAL_TABLET | Freq: Three times a day (TID) | ORAL | 0 refills | Status: DC | PRN

## 2019-01-18 NOTE — ED Notes (Signed)
Pt call light going off, pt requesting water, informed that we will have to wait to provide him water until test results

## 2019-01-18 NOTE — ED Notes (Signed)
2 attempts at IV access unsuccessful; pt tolerated well; charge RN, Selinda Eon in to see pt

## 2019-01-18 NOTE — Discharge Instructions (Signed)
Please take the antibiotics fully for 10 days even if you feel better after few days.  It is very important that you follow-up with your doctor in 24 hours for repeat abdominal examination.  As explained to you, your diverticulitis may lead to perforation, abscess, or severe disease which may require surgery and can make you very sick.  Therefore it is very important that you follow-up with your doctor and return to the emergency room if your symptoms are not improving, if your abdominal pain is getting worse, if you have a fever at home, or if your nausea and vomiting return.  Take Percocet and Zofran as needed for pain and nausea.

## 2019-01-18 NOTE — ED Provider Notes (Signed)
[facility] Emergency Department Provider Note  ____________________________________________  Time seen: Approximately 1:55 AM  I have reviewed the triage vital signs and the nursing notes.   HISTORY  Chief Complaint Abdominal Pain and Emesis   HPI Jermaine Ray is a 48 y.o. male who presents for evaluation of abdominal pain.  Patient is complaining of 2 days of periumbilical sharp abdominal pain.  Currently the pain is mild but with palpation become severe.  Today started having several episodes of nonbloody nonbilious emesis.  He does have umbilical hernia that he had for a while and is reducible.  He denies testicular pain or swelling, fever or chills, diarrhea or constipation.  Last bowel movement was this morning.  Patient has had 2 prior hydrocele surgeries but no intra-abdominal surgeries in the past.   PMH Hydrocele  Past Surgical History:  Procedure Laterality Date  . HYDROCELE EXCISION / REPAIR     x2    Prior to Admission medications   Medication Sig Start Date End Date Taking? Authorizing Provider  amoxicillin-clavulanate (AUGMENTIN) 875-125 MG tablet Take 1 tablet by mouth 2 (two) times daily for 10 days. 01/18/19 01/28/19  Rudene Re, MD  metroNIDAZOLE (FLAGYL) 500 MG tablet Take 1 tablet (500 mg total) by mouth 3 (three) times daily for 10 days. 01/18/19 01/28/19  Rudene Re, MD  ondansetron (ZOFRAN ODT) 4 MG disintegrating tablet Take 1 tablet (4 mg total) by mouth every 8 (eight) hours as needed. 01/18/19   Rudene Re, MD  oxyCODONE-acetaminophen (PERCOCET) 5-325 MG tablet Take 1 tablet by mouth every 4 (four) hours as needed. 01/18/19   Rudene Re, MD    Allergies Patient has no known allergies.  No family history on file.  Social History Social History   Tobacco Use  . Smoking status: Former Smoker    Quit date: 11/30/2014    Years since quitting: 4.1  . Smokeless tobacco: Never Used  .  Tobacco comment: "vaping occasionally"  Substance Use Topics  . Alcohol use: Yes    Comment: occasionally  . Drug use: Not on file    Review of Systems  Constitutional: Negative for fever. Eyes: Negative for visual changes. ENT: Negative for sore throat. Neck: No neck pain  Cardiovascular: Negative for chest pain. Respiratory: Negative for shortness of breath. Gastrointestinal: + abdominal pain, nausea, and vomiting. No diarrhea. Genitourinary: Negative for dysuria. Musculoskeletal: Negative for back pain. Skin: Negative for rash. Neurological: Negative for headaches, weakness or numbness. Psych: No SI or HI  ____________________________________________   PHYSICAL EXAM:  VITAL SIGNS: ED Triage Vitals  Enc Vitals Group     BP 01/18/19 0005 (!) 106/57     Pulse Rate 01/18/19 0005 70     Resp 01/18/19 0005 18     Temp 01/18/19 0005 98.7 F (37.1 C)     Temp Source 01/18/19 0005 Oral     SpO2 01/18/19 0005 96 %     Weight 01/18/19 0016 200 lb (90.7 kg)     Height 01/18/19 0016 5\' 10"  (1.778 m)     Head Circumference --      Peak Flow --      Pain Score 01/18/19 0015 10     Pain Loc --      Pain Edu? --      Excl. in [place]? --     Constitutional: Alert and oriented. Well appearing and in no apparent distress. HEENT:      Head: Normocephalic and atraumatic.  Eyes: Conjunctivae are normal. Sclera is non-icteric.       Mouth/Throat: Mucous membranes are moist.       Neck: Supple with no signs of meningismus. Cardiovascular: Regular rate and rhythm. No murmurs, gallops, or rubs. 2+ symmetrical distal pulses are present in all extremities. No JVD. Respiratory: Normal respiratory effort. Lungs are clear to auscultation bilaterally. No wheezes, crackles, or rhonchi.  Gastrointestinal: Soft reducible umbilical hernia, abdomen is diffusely tender to palpation, worse in the periumbilical/suprapubic region with no rebound or guarding, no distention and positive bowel  sounds.  Musculoskeletal: Nontender with normal range of motion in all extremities. No edema, cyanosis, or erythema of extremities. Neurologic: Normal speech and language. Face is symmetric. Moving all extremities. No gross focal neurologic deficits are appreciated. Skin: Skin is warm, dry and intact. No rash noted. Psychiatric: Mood and affect are normal. Speech and behavior are normal.  ____________________________________________   LABS (all labs ordered are listed, but only abnormal results are displayed)  Labs Reviewed  CBC WITH DIFFERENTIAL/PLATELET - Abnormal; Notable for the following components:      Result Value   WBC 24.0 (*)    Neutro Abs 21.5 (*)    Abs Immature Granulocytes 0.10 (*)    All other components within normal limits  COMPREHENSIVE METABOLIC PANEL - Abnormal; Notable for the following components:   Glucose, Bld 136 (*)    Creatinine, Ser 1.30 (*)    Total Protein 8.2 (*)    All other components within normal limits  LIPASE, BLOOD  LACTIC ACID, PLASMA   ____________________________________________  EKG  none  ____________________________________________  RADIOLOGY  I have personally reviewed the images performed during this visit and I agree with the Radiologist's read.   Interpretation by Radiologist:    CLINICAL DATA: Abdominal pain. Diarrhea.  EXAM: CT ABDOMEN AND PELVIS WITH CONTRAST  TECHNIQUE: Multidetector CT imaging of the abdomen and pelvis was performed using the standard protocol following bolus administration of intravenous contrast.  CONTRAST: 172mL OMNIPAQUE IOHEXOL 300 MG/ML SOLN  COMPARISON: None.  FINDINGS: Lower chest: The lung bases are clear. The heart size is normal.  Hepatobiliary: The liver is normal. Normal gallbladder.There is no biliary ductal dilation.  Pancreas: Normal contours without ductal dilatation. No peripancreatic fluid collection.  Spleen: No splenic laceration or hematoma.  Adrenals/Urinary  Tract:  --Adrenal glands: No adrenal hemorrhage.  --Right kidney/ureter: No hydronephrosis or perinephric hematoma.  --Left kidney/ureter: No hydronephrosis or perinephric hematoma.  --Urinary bladder: Unremarkable.  Stomach/Bowel:  --Stomach/Duodenum: There is a small hiatal hernia.  --Small bowel: There are mildly dilated loops of small bowel scattered throughout the abdomen likely representing a reactive ileus  --Colon: There is sigmoid diverticulitis without evidence for associated abscess. There is sigmoid diverticulosis. There is no free air.  --Appendix: Normal.  Vascular/Lymphatic: Atherosclerotic calcification is present within the non-aneurysmal abdominal aorta, without hemodynamically significant stenosis.  --No retroperitoneal lymphadenopathy.  --No mesenteric lymphadenopathy.  --No pelvic or inguinal lymphadenopathy.  Reproductive: Unremarkable  Other: There is a small amount of reactive free fluid in the right lower quadrant. There is infiltration of the omentum without hyperenhancement or thickening of the peritoneum. There is a small fat containing periumbilical hernia.  Musculoskeletal. No acute displaced fractures.  IMPRESSION: 1. Acute sigmoid diverticulitis. No abscess formation or free air. 2. Mild infiltration of the omentum raises concern for developing early peritonitis. 3. Low-grade small-bowel ileus. 4. Aortic Atherosclerosis (ICD10-I70.0).   Electronically Signed By: Constance Holster M.D. On: 01/18/2019 01:57 ____________________________________________   PROCEDURES  Procedure(s) performed: None Procedures Critical Care performed:  None ____________________________________________   INITIAL IMPRESSION / ASSESSMENT AND PLAN / ED COURSE  48 y.o. male who presents for evaluation of abdominal pain x 2 days now with nausea and vomiting.  Differential diagnosis including appendicitis versus diverticulitis versus  incarcerated/strangulated hernia versus SBO versus pyelonephritis versus kidney stone.  Patient is well-appearing in no distress with normal vitals, abdomen is soft with no distention, he is tender to palpation in the periumbilical and suprapubic region with no rebound or guarding.  He does have a small umbilical hernia however that is soft and reducible.   Labs showing white count of 24 with normal lactic acid.  Chemistry panel within normal limits.  CT concerning for diverticulitis with no perforation but mild infiltration of the omentum raising concern for a developing early peritonitis.  Patient passing flatus and no further episodes of vomiting here.  Patient also has an elevated white count therefore recommended admission for IV antibiotics however patient reports that he cannot stay because he has a dog to take care of.  Explained to the patient the risks of going home including perforation, abscess, intra-abdominal infection, need for colectomy, sepsis, and death.  Patient understands these risks.  These were discussed with him in front of his father who was in the room.  He continues to request to be discharged home. Will give a dose of IV Zosyn and discharge patient home on Flagyl and Augmentin.  Discussed very strict return precautions for any worsening abdominal pain, fever, or recurrence of nausea and vomiting.  Otherwise recommended close follow-up with PCP.       As part of my medical decision making, I reviewed the following data within the [facility] notes reviewed and incorporated, Labs reviewed , Old chart reviewed, Radiograph reviewed , Notes from prior ED visits and [place] Controlled Substance Database   Patient was evaluated in Emergency Department today for the symptoms described in the history of present illness. Patient was evaluated in the context of the global COVID-19 pandemic, which necessitated consideration that the patient might be at risk for  infection with the SARS-CoV-2 virus that causes COVID-19. Institutional protocols and algorithms that pertain to the evaluation of patients at risk for COVID-19 are in a state of rapid change based on information released by regulatory bodies including the CDC and federal and state organizations. These policies and algorithms were followed during the patient's care in the ED.   ____________________________________________   FINAL CLINICAL IMPRESSION(S) / ED DIAGNOSES   Final diagnoses:  Diverticulitis of large intestine without bleeding, unspecified complication status      NEW MEDICATIONS STARTED DURING THIS VISIT:  ED Discharge Orders         Ordered    oxyCODONE-acetaminophen (PERCOCET) 5-325 MG tablet  Every 4 hours PRN     01/18/19 0301    ondansetron (ZOFRAN ODT) 4 MG disintegrating tablet  Every 8 hours PRN     01/18/19 0301    amoxicillin-clavulanate (AUGMENTIN) 875-125 MG tablet  2 times daily     01/18/19 0301    metroNIDAZOLE (FLAGYL) 500 MG tablet  3 times daily     01/18/19 0301           Note:  This document was prepared using Dragon voice recognition software and may include unintentional dictation errors.    Alfred Levins, [place], MD 01/18/19 [phone]

## 2019-01-18 NOTE — ED Notes (Signed)
Pt reports mild low midline abd pain for a few days; tonight the pain became suddenly stronger; says at home it was sharp and rated it 8/10; currently pt says his pain is 2/10 and aching; vomited once earlier today; abd soft, tender on palpation; history of hernia he has not had repaired; father at bedside;

## 2019-01-18 NOTE — ED Notes (Signed)
Pt requesting something to drink; understands CT results are not back yet so we have to wait to see what that shows; verbalized understanding; father at bedside;

## 2019-01-18 NOTE — ED Triage Notes (Signed)
Patient to ED complaining of umbilical abdominal pain. Patient states he has had diarrhea for a few days. States vomiting and diaphoresis tonight as well. Has put off having his hernia repaired and is not sure if this is causing his severe pain or not.

## 2020-02-11 ENCOUNTER — Other Ambulatory Visit: Payer: 59

## 2020-02-15 ENCOUNTER — Ambulatory Visit: Admission: RE | Admit: 2020-02-15 | Payer: 59 | Source: Home / Self Care | Admitting: Internal Medicine

## 2020-02-15 ENCOUNTER — Encounter: Admission: RE | Payer: Self-pay | Source: Home / Self Care

## 2020-02-15 SURGERY — COLONOSCOPY WITH PROPOFOL
Anesthesia: General

## 2021-08-21 ENCOUNTER — Encounter
Admission: RE | Disposition: A | Discharge status: Home / Self Care | Payer: Self-pay | Source: Home / Self Care | Attending: Internal Medicine

## 2021-08-21 ENCOUNTER — Ambulatory Visit: Payer: 59 | Admitting: Anesthesiology

## 2021-08-21 ENCOUNTER — Encounter: Payer: Self-pay | Admitting: Internal Medicine

## 2021-08-21 ENCOUNTER — Other Ambulatory Visit: Payer: Self-pay

## 2021-08-21 ENCOUNTER — Ambulatory Visit
Admission: RE | Admit: 2021-08-21 | Discharge: 2021-08-21 | Disposition: A | Discharge status: Home / Self Care | Payer: 59 | Attending: Internal Medicine | Admitting: Internal Medicine

## 2021-08-21 DIAGNOSIS — K641 Second degree hemorrhoids: Secondary | ICD-10-CM | POA: Insufficient documentation

## 2021-08-21 DIAGNOSIS — Z1211 Encounter for screening for malignant neoplasm of colon: Secondary | ICD-10-CM | POA: Insufficient documentation

## 2021-08-21 DIAGNOSIS — K5730 Diverticulosis of large intestine without perforation or abscess without bleeding: Secondary | ICD-10-CM | POA: Insufficient documentation

## 2021-08-21 DIAGNOSIS — D125 Benign neoplasm of sigmoid colon: Secondary | ICD-10-CM | POA: Insufficient documentation

## 2021-08-21 HISTORY — PX: COLONOSCOPY: SHX5424

## 2021-08-21 SURGERY — COLONOSCOPY
Anesthesia: General

## 2021-08-21 MED ORDER — PROPOFOL 500 MG/50ML IV EMUL
INTRAVENOUS | Status: DC | PRN
  Administered 2021-08-21: 200 ug/kg/min via INTRAVENOUS

## 2021-08-21 MED ORDER — LIDOCAINE HCL (CARDIAC) PF 100 MG/5ML IV SOSY
PREFILLED_SYRINGE | INTRAVENOUS | Status: DC | PRN
  Administered 2021-08-21: 50 mg via INTRAVENOUS

## 2021-08-21 MED ORDER — PROPOFOL 10 MG/ML IV BOLUS
INTRAVENOUS | Status: DC | PRN
  Administered 2021-08-21: 80 mg via INTRAVENOUS

## 2021-08-21 MED ORDER — LIDOCAINE HCL (PF) 2 % IJ SOLN
INTRAMUSCULAR | Status: AC
  Filled 2021-08-21: qty 5

## 2021-08-21 MED ORDER — SODIUM CHLORIDE 0.9 % IV SOLN: INTRAVENOUS | Status: DC

## 2021-08-21 NOTE — H&P (Signed)
Outpatient short stay form Pre-procedure 08/21/2021 3:01 PM Amer Alcindor K. Alice Reichert, M.D.  Primary Physician: [place] Pink, M.D.  Reason for visit:  hx of diverticulitis, colon cancer screening  History of present illness:  51 y/o male with hx of diverticulitis presents for colon cancer screening. Patient denies change in bowel habits, rectal bleeding, weight loss or abdominal pain.      Current Facility-Administered Medications:    0.9 %  sodium chloride infusion, , Intravenous, Continuous, Sharief Wainwright, Benay Pike, MD, Last Rate: 20 mL/hr at 08/21/21 1329, New Bag at 08/21/21 1329  Medications Prior to Admission  Medication Sig Dispense Refill Last Dose   ondansetron (ZOFRAN ODT) 4 MG disintegrating tablet Take 1 tablet (4 mg total) by mouth every 8 (eight) hours as needed. (Patient not taking: Reported on 08/21/2021) 20 tablet 0 Not Taking   oxyCODONE-acetaminophen (PERCOCET) 5-325 MG tablet Take 1 tablet by mouth every 4 (four) hours as needed. (Patient not taking: Reported on 08/21/2021) 12 tablet 0 Not Taking     No Known Allergies   History reviewed. No pertinent past medical history.  Review of systems:  Otherwise negative.    Physical Exam  Gen: Alert, oriented. Appears stated age.  HEENT: [place]/AT. PERRLA. Lungs: CTA, no wheezes. CV: RR nl S1, S2. Abd: soft, benign, no masses. BS+ Ext: No edema. Pulses 2+    Planned procedures: Proceed with colonoscopy. The patient understands the nature of the planned procedure, indications, risks, alternatives and potential complications including but not limited to bleeding, infection, perforation, damage to internal organs and possible oversedation/side effects from anesthesia. The patient agrees and gives consent to proceed.  Please refer to procedure notes for findings, recommendations and patient disposition/instructions.     Dacoda Finlay K. Alice Reichert, M.D. Gastroenterology 08/21/2021  3:01 PM

## 2021-08-21 NOTE — Op Note (Signed)
[facility] Gastroenterology Patient Name: Jermaine Ray Procedure Date: 08/21/2021 2:59 PM MRN: [id number] Account #: [id number] Date of Birth: 03/10/1971 Admit Type: Outpatient Age: 51 Room: [facility] ENDO ROOM 2 Gender: Male Note Status: Finalized Instrument Name: Jasper Riling [id number] Procedure:             Colonoscopy Indications:           Screening for colorectal malignant neoplasm Providers:             Lorie Apley K. Alice Reichert MD, MD Referring MD:          Irven Easterly. Kary Kos, MD (Referring MD) Medicines:             Propofol per Anesthesia Complications:         No immediate complications. Procedure:             Pre-Anesthesia Assessment:                        - The risks and benefits of the procedure and the                         sedation options and risks were discussed with the                         patient. All questions were answered and informed                         consent was obtained.                        - Patient identification and proposed procedure were                         verified prior to the procedure by the nurse. The                         procedure was verified in the procedure room.                        - ASA Grade Assessment: II - A patient with mild                         systemic disease.                        - After reviewing the risks and benefits, the patient                         was deemed in satisfactory condition to undergo the                         procedure.                        After obtaining informed consent, the colonoscope was                         passed under direct vision. Throughout the procedure,  the patient's blood pressure, pulse, and oxygen                         saturations were monitored continuously. The                         Colonoscope was introduced through the anus and                         advanced to the the cecum, identified by appendiceal                          orifice and ileocecal valve. The colonoscopy was                         performed without difficulty. The patient tolerated                         the procedure well. The quality of the bowel                         preparation was good. Findings:      The perianal and digital rectal examinations were normal. Pertinent       negatives include normal sphincter tone and no palpable rectal lesions.      Many small-mouthed diverticula were found in the sigmoid colon and       ascending colon.      Two sessile polyps were found in the sigmoid colon. The polyps were 4 to       6 mm in size. These polyps were removed with a jumbo cold forceps.       Resection and retrieval were complete.      Non-bleeding internal hemorrhoids were found during retroflexion. The       hemorrhoids were Grade II (internal hemorrhoids that prolapse but reduce       spontaneously).      The exam was otherwise without abnormality. Impression:            - Diverticulosis in the sigmoid colon and in the                         ascending colon.                        - Two 4 to 6 mm polyps in the sigmoid colon, removed                         with a jumbo cold forceps. Resected and retrieved.                        - Non-bleeding internal hemorrhoids.                        - The examination was otherwise normal. Recommendation:        - Patient has a contact number available for                         emergencies. The signs and symptoms of potential  delayed complications were discussed with the patient.                         Return to normal activities tomorrow. Written                         discharge instructions were provided to the patient.                        - High fiber diet.                        - Use Benefiber two teaspoons PO daily.                        - Return to GI office PRN.                        - Repeat colonoscopy date to be determined after                          pending pathology results are reviewed for                         surveillance.                        - The findings and recommendations were discussed with                         the patient. Procedure Code(s):     --- Professional ---                        [phone], Colonoscopy, flexible; with biopsy, single or                         multiple Diagnosis Code(s):     --- Professional ---                        K57.30, Diverticulosis of large intestine without                         perforation or abscess without bleeding                        K63.5, Polyp of colon                        K64.1, Second degree hemorrhoids                        Z12.11, Encounter for screening for malignant neoplasm                         of colon CPT copyright 2019 [employer]. All rights reserved. The codes documented in this report are preliminary and upon coder review may  be revised to meet current compliance requirements. Efrain Sella MD, MD 08/21/2021 3:21:57 PM This report has been signed electronically. Number of Addenda: 0 Note Initiated On: 08/21/2021 2:59 PM Scope Withdrawal Time: 0 hours 6 minutes  16 seconds  Total Procedure Duration: 0 hours 9 minutes 55 seconds  Estimated Blood Loss:  Estimated blood loss: none.      [facility]

## 2021-08-21 NOTE — Anesthesia Preprocedure Evaluation (Signed)
Anesthesia Evaluation  Patient identified by MRN, date of birth, ID band Patient awake    Reviewed: Allergy & Precautions, NPO status , Patient's Chart, lab work & pertinent test results  History of Anesthesia Complications Negative for: history of anesthetic complications  Airway Mallampati: II  TM Distance: >3 FB Neck ROM: Full    Dental no notable dental hx. (+) Teeth Intact   Pulmonary neg pulmonary ROS, neg sleep apnea, neg COPD, Patient abstained from smoking.Not current smoker, former smoker,    Pulmonary exam normal breath sounds clear to auscultation       Cardiovascular Exercise Tolerance: Good METS(-) hypertension(-) CAD and (-) Past MI negative cardio ROS  (-) dysrhythmias  Rhythm:Regular Rate:Normal - Systolic murmurs    Neuro/Psych negative neurological ROS  negative psych ROS   GI/Hepatic neg GERD  ,(+)     (-) substance abuse  ,   Endo/Other  neg diabetes  Renal/GU negative Renal ROS     Musculoskeletal   Abdominal   Peds  Hematology   Anesthesia Other Findings History reviewed. No pertinent past medical history.  Reproductive/Obstetrics                             Anesthesia Physical Anesthesia Plan  ASA: 1  Anesthesia Plan: General   Post-op Pain Management: Minimal or no pain anticipated   Induction: Intravenous  PONV Risk Score and Plan: 2 and Propofol infusion, TIVA and Ondansetron  Airway Management Planned: Nasal Cannula  Additional Equipment: None  Intra-op Plan:   Post-operative Plan:   Informed Consent: I have reviewed the patients History and Physical, chart, labs and discussed the procedure including the risks, benefits and alternatives for the proposed anesthesia with the patient or authorized representative who has indicated his/her understanding and acceptance.     Dental advisory given  Plan Discussed with: CRNA and  Surgeon  Anesthesia Plan Comments: (Discussed risks of anesthesia with patient, including possibility of difficulty with spontaneous ventilation under anesthesia necessitating airway intervention, PONV, and rare risks such as cardiac or respiratory or neurological events, and allergic reactions. Discussed the role of CRNA in patient's perioperative care. Patient understands.)        Anesthesia Quick Evaluation

## 2021-08-21 NOTE — Anesthesia Postprocedure Evaluation (Signed)
Anesthesia Post Note  Patient: Jermaine Ray  Procedure(s) Performed: COLONOSCOPY  Patient location during evaluation: Endoscopy Anesthesia Type: General Level of consciousness: awake and alert Pain management: pain level controlled Vital Signs Assessment: post-procedure vital signs reviewed and stable Respiratory status: spontaneous breathing, nonlabored ventilation, respiratory function stable and patient connected to nasal cannula oxygen Cardiovascular status: blood pressure returned to baseline and stable Postop Assessment: no apparent nausea or vomiting Anesthetic complications: no   No notable events documented.   Last Vitals:  Vitals:   08/21/21 1532 08/21/21 1542  BP: 96/67 119/80  Pulse:  60  Resp: 18   Temp:    SpO2:  100%    Last Pain:  Vitals:   08/21/21 1542  TempSrc:   PainSc: 0-No pain                 Arita Miss

## 2021-08-21 NOTE — Transfer of Care (Signed)
Immediate Anesthesia Transfer of Care Note  Patient: Jermaine Ray  Procedure(s) Performed: COLONOSCOPY  Patient Location: PACU  Anesthesia Type:General  Level of Consciousness: drowsy  Airway & Oxygen Therapy: Patient Spontanous Breathing  Post-op Assessment: Report given to RN  Post vital signs: Reviewed and stable  Last Vitals:  Vitals Value Taken Time  BP 105/92 08/21/21 1523  Temp 35.8 C 08/21/21 1522  Pulse 67 08/21/21 1524  Resp 15 08/21/21 1524  SpO2 97 % 08/21/21 1524  Vitals shown include unvalidated device data.  Last Pain:  Vitals:   08/21/21 1522  TempSrc: Temporal  PainSc: Asleep         Complications: No notable events documented.

## 2021-08-21 NOTE — Interval H&P Note (Signed)
History and Physical Interval Note:  08/21/2021 3:02 PM  Jermaine Ray  has presented today for surgery, with the diagnosis of SCREENING DIVERTICULITIS IBS.  The various methods of treatment have been discussed with the patient and family. After consideration of risks, benefits and other options for treatment, the patient has consented to  Procedure(s): COLONOSCOPY (N/A) as a surgical intervention.  The patient's history has been reviewed, patient examined, no change in status, stable for surgery.  I have reviewed the patient's chart and labs.  Questions were answered to the patient's satisfaction.     [place]annon [place], [place]

## 2021-08-22 ENCOUNTER — Encounter: Payer: Self-pay | Admitting: Internal Medicine

## 2021-08-23 LAB — SURGICAL PATHOLOGY

## 2023-06-24 ENCOUNTER — Ambulatory Visit: Payer: Self-pay | Admitting: Surgery

## 2023-06-24 NOTE — H&P (Signed)
 Subjective:   CC: Umbilical hernia without obstruction and without gangrene [K42.9]  HPI:  Jermaine Ray is a 53 y.o. male who was referred by Massie Soles,* for evaluation of above. Symptoms were first noted several years ago. Some discomfort with pressure in area. reducible   Past Medical History:  has a past medical history of Chickenpox, Depression, Hyperlipidemia, IBS (irritable bowel syndrome), and Migraines.  Past Surgical History:  Past Surgical History:  Procedure Laterality Date   COLONOSCOPY  08/21/2021   H/O Colonic Polyps/Repeat 5 years/TKT   HYDROCELE EXCISION / REPAIR      Family History: family history includes ALS (age of onset: 74) in his sister; Alcohol abuse in his maternal grandfather; Cancer in his paternal grandfather and paternal grandmother; Diabetes type II in his father; Hyperlipidemia (Elevated cholesterol) in his father; Myocardial Infarction (Heart attack) (age of onset: 60) in his father; Osteoporosis (Thinning of bones) in his maternal grandfather and maternal grandmother; Stroke in his maternal grandmother.  Social History:  reports that he quit smoking about 8 years ago. His smoking use included cigarettes. He has never used smokeless tobacco. He reports current alcohol use. He reports that he does not use drugs.  Current Medications: has a current medication list which includes the following prescription(s): eluxadoline, furosemide, norethindrone-ethinyl estradiol-iron, polyethylene glycol, and terbinafine hcl.  Allergies:  Allergies as of 06/24/2023   (No Known Allergies)    ROS:  A 15 point review of systems was performed and pertinent positives and negatives noted in HPI   Objective:     BP 129/85   Pulse 76   Ht 177.8 cm (5\' 10" )   Wt 90.7 kg (200 lb)   BMI 28.70 kg/m   Constitutional :  Alert, cooperative, no distress  Lymphatics/Throat:  Supple, no lymphadenopathy  Respiratory:  clear to auscultation  bilaterally  Cardiovascular:  regular rate and rhythm  Gastrointestinal: soft, non-tender; bowel sounds normal; no masses,  no organomegaly. umbilical hernia noted.  small and reducible  Musculoskeletal: Steady gait and movement  Skin: Cool and moist  Psychiatric: Normal affect, non-agitated, not confused       LABS:  N/a   RADS: N/a Assessment:       Umbilical hernia without obstruction and without gangrene [K42.9]  Plan:     1. Umbilical hernia without obstruction and without gangrene [K42.9]   Discussed the risk of surgery including recurrence, which can be up to 50% in the case of incisional or complex hernias, possible use of prosthetic materials (mesh) and the increased risk of mesh infxn if used, bleeding, chronic pain, post-op infxn, post-op SBO or ileus, and possible re-operation to address said risks. The risks of general anesthetic, if used, includes MI, CVA, sudden death or even reaction to anesthetic medications also discussed. Alternatives include continued observation.  Benefits include possible symptom relief, prevention of incarceration, strangulation, enlargement in size over time, and the risk of emergency surgery in the face of strangulation.   Typical post-op recovery time of 3-5 days with 2 weeks of activity restrictions were also discussed.  ED return precautions given for sudden increase in pain, size of hernia with accompanying fever, nausea, and/or vomiting.  The patient verbalized understanding and all questions were answered to the patient's satisfaction.   2. Patient has elected to proceed with surgical treatment. Procedure will be scheduled. Open due to size with mesh.  Off work one week, likely three weeks of lifting restrictions.  labs/images/medications/previous chart entries reviewed personally and relevant  changes/updates noted above.

## 2024-02-24 ENCOUNTER — Ambulatory Visit: Payer: Self-pay | Admitting: Surgery

## 2024-02-24 NOTE — H&P (Signed)
 Subjective:    CC: Umbilical hernia without obstruction and without gangrene [K42.9]   HPI: Subjective Jermaine Ray is a 53 y.o. male who returns for evaluation of above. Symptoms were first noted several years ago. Some discomfort with pressure in area. Reducible.   Returns to attempt scheduling again. No changes since last visit   Past Medical History:  has a past medical history of Chickenpox, Depression, Hyperlipidemia, IBS (irritable bowel syndrome), and Migraines.   Past Surgical History:  Past Surgical History       Past Surgical History:  Procedure Laterality Date   COLONOSCOPY   08/21/2021    H/O Colonic Polyps/Repeat 5 years/TKT   HYDROCELE EXCISION / REPAIR            Family History: family history includes ALS (age of onset: 92) in his sister; Alcohol abuse in his maternal grandfather; Cancer in his paternal grandfather and paternal grandmother; Diabetes type II in his father; Hyperlipidemia (Elevated cholesterol) in his father; Myocardial Infarction (Heart attack) (age of onset: 40) in his father; Osteoporosis (Thinning of bones) in his maternal grandfather and maternal grandmother; Stroke in his maternal grandmother.   Social History:  reports that he quit smoking about 9 years ago. His smoking use included cigarettes. He has never used smokeless tobacco. He reports current alcohol use. He reports that he does not use drugs.   Current Medications: has a current medication list which includes the following prescription(s): ferrous sulfate, furosemide, multivitamin, polyethylene glycol, terbinafine hcl, and ascorbic acid (vitamin c).   Allergies:     Allergies as of 02/24/2024   (No Known Allergies)      ROS:  A 15 point review of systems was performed and pertinent positives and negatives noted in HPI   Objective:    Objective BP (!) 141/85   Pulse 80   Ht 177.8 cm (5' 10)   Wt 89.8 kg (198 lb)   BMI 28.41 kg/m    Constitutional :  Alert,  cooperative, no distress  Lymphatics/Throat:  Supple, no lymphadenopathy  Respiratory:  clear to auscultation bilaterally  Cardiovascular:  regular rate and rhythm  Gastrointestinal: soft, non-tender; bowel sounds normal; no masses,  no organomegaly. umbilical hernia noted.  small and reducible  Musculoskeletal: Steady gait and movement  Skin: Cool and moist  Psychiatric: Normal affect, non-agitated, not confused         LABS:  N/a    RADS: N/a Assessment:    Assessment Umbilical hernia without obstruction and without gangrene [K42.9]   Plan:    Plan 1. Umbilical hernia without obstruction and without gangrene [K42.9]   Discussed the risk of surgery including recurrence, which can be up to 50% in the case of incisional or complex hernias, possible use of prosthetic materials (mesh) and the increased risk of mesh infxn if used, bleeding, chronic pain, post-op infxn, post-op SBO or ileus, and possible re-operation to address said risks. The risks of general anesthetic, if used, includes MI, CVA, sudden death or even reaction to anesthetic medications also discussed. Alternatives include continued observation.  Benefits include possible symptom relief, prevention of incarceration, strangulation, enlargement in size over time, and the risk of emergency surgery in the face of strangulation.    Typical post-op recovery time of 3-5 days with 2 weeks of activity restrictions were also discussed.   ED return precautions given for sudden increase in pain, size of hernia with accompanying fever, nausea, and/or vomiting.   The patient verbalized understanding and all questions  were answered to the patient's satisfaction.     2. Patient has elected to proceed with surgical treatment. Procedure will be scheduled. Open due to size with mesh.  Off work one week, likely three weeks of lifting restrictions.   labs/images/medications/previous chart entries reviewed personally and relevant  changes/updates noted above.

## 2024-04-02 ENCOUNTER — Inpatient Hospital Stay
Admission: RE | Admit: 2024-04-02 | Discharge: 2024-04-02 | Disposition: A | Discharge status: Home / Self Care | Source: Ambulatory Visit

## 2024-04-02 HISTORY — DX: Umbilical hernia without obstruction or gangrene: K42.9

## 2024-04-02 NOTE — Patient Instructions (Signed)
 Your procedure is scheduled on: 04/09/24 - Thursday Report to the Registration Desk on the 1st floor of the [facility]. To find out your arrival time, please call [phone] between 1PM - 3PM on: 04/08/24 - Wednesday If your arrival time is 6:00 am, do not arrive before that time as the [facility] entrance doors do not open until 6:00 am.  REMEMBER: Instructions that are not followed completely may result in serious medical risk, up to and including death; or upon the discretion of your surgeon and anesthesiologist your surgery may need to be rescheduled.  Do not eat food after midnight the night before surgery.  No gum chewing or hard candies.  You may however, drink CLEAR liquids up to 2 hours before you are scheduled to arrive for your surgery. Do not drink anything within 2 hours of your scheduled arrival time.  Clear liquids include: - water  - apple juice without pulp - gatorade (not RED colors) - black coffee or tea (Do NOT add milk or creamers to the coffee or tea) Do NOT drink anything that is not on this list.  One week prior to surgery: Stop Anti-inflammatories (NSAIDS) such as Advil, Aleve, Ibuprofen, Motrin, Naproxen, Naprosyn and Aspirin  based products such as Excedrin, Goody's Powder, BC Powder. You may take Tylenol  if needed for pain up until the day of surgery.  Stop ANY OVER THE COUNTER supplements until after surgery.  ON THE DAY OF SURGERY ONLY TAKE THESE MEDICATIONS WITH SIPS OF WATER:  none   No Alcohol for 24 hours before or after surgery.  No Smoking including e-cigarettes for 24 hours before surgery.  No chewable tobacco products for at least 6 hours before surgery.  No nicotine patches on the day of surgery.  Do not use any recreational drugs for at least a week (preferably 2 weeks) before your surgery.  Please be advised that the combination of cocaine and anesthesia may have negative outcomes, up to and including death. If you test positive  for cocaine, your surgery will be cancelled.  On the morning of surgery brush your teeth with toothpaste and water, you may rinse your mouth with mouthwash if you wish. Do not swallow any toothpaste or mouthwash.  Use CHG Soap or wipes as directed on instruction sheet.  Do not wear jewelry, make-up, hairpins, clips or nail polish.  For welded (permanent) jewelry: bracelets, anklets, waist bands, etc.  Please have this removed prior to surgery.  If it is not removed, there is a chance that hospital personnel will need to cut it off on the day of surgery.  Do not wear lotions, powders, or perfumes.   Do not shave body hair from the neck down 48 hours before surgery.  Contact lenses, hearing aids and dentures may not be worn into surgery.  Do not bring valuables to the hospital. [facility] is not responsible for any missing/lost belongings or valuables.   Notify your doctor if there is any change in your medical condition (cold, fever, infection).  Wear comfortable clothing (specific to your surgery type) to the hospital.  After surgery, you can help prevent lung complications by doing breathing exercises.  Take deep breaths and cough every 1-2 hours. Your doctor may order a device called an Incentive Spirometer to help you take deep breaths.  When coughing or sneezing, hold a pillow firmly against your incision with both hands. This is called splinting. Doing this helps protect your incision. It also decreases belly discomfort.  If  you are being admitted to the hospital overnight, leave your suitcase in the car. After surgery it may be brought to your room.  In case of increased patient census, it may be necessary for you, the patient, to continue your postoperative care in the Same Day Surgery department.  If you are being discharged the day of surgery, you will not be allowed to drive home. You will need a responsible individual to drive you home and stay with you for 24 hours  after surgery.   If you are taking public transportation, you will need to have a responsible individual with you.  Please call the Pre-admissions Testing Dept. at [phone] if you have any questions about these instructions.  Surgery Visitation Policy:  Patients having surgery or a procedure may have two visitors.  Children under the age of 58 must have an adult with them who is not the patient.  Inpatient Visitation:    Visiting hours are 7 a.m. to 8 p.m. Up to four visitors are allowed at one time in a patient room. The visitors may rotate out with other people during the day.  One visitor age 60 or older may stay with the patient overnight and must be in the room by 8 p.m.   Merch[employer], Retail to address health-related social needs:  [email or web address]

## 2024-04-03 ENCOUNTER — Other Ambulatory Visit: Payer: Self-pay

## 2024-04-03 ENCOUNTER — Encounter
Admission: RE | Admit: 2024-04-03 | Discharge: 2024-04-03 | Disposition: A | Payer: Self-pay | Source: Ambulatory Visit | Attending: Surgery

## 2024-04-03 NOTE — Patient Instructions (Addendum)
 Your procedure is scheduled on: 04/09/24 - Thursday Report to the Registration Desk on the 1st floor of the [facility]. To find out your arrival time, please call [phone] between 1PM - 3PM on: 04/08/24 - Wednesday If your arrival time is 6:00 am, do not arrive before that time as the [facility] entrance doors do not open until 6:00 am.  REMEMBER: Instructions that are not followed completely may result in serious medical risk, up to and including death; or upon the discretion of your surgeon and anesthesiologist your surgery may need to be rescheduled.  Do not eat food after midnight the night before surgery.  No gum chewing or hard candies.  You may however, drink CLEAR liquids up to 2 hours before you are scheduled to arrive for your surgery. Do not drink anything within 2 hours of your scheduled arrival time.  Clear liquids include: - water  - apple juice without pulp - gatorade (not RED colors) - black coffee or tea (Do NOT add milk or creamers to the coffee or tea) Do NOT drink anything that is not on this list.  One week prior to surgery: Stop Anti-inflammatories (NSAIDS) such as Advil, Aleve, Ibuprofen, Motrin, Naproxen, Naprosyn and Aspirin  based products such as Excedrin, Goody's Powder, BC Powder. You may take Tylenol  if needed for pain up until the day of surgery.  Stop ANY OVER THE COUNTER supplements until after surgery.  ON THE DAY OF SURGERY ONLY TAKE THESE MEDICATIONS WITH SIPS OF WATER:  none   No Alcohol for 24 hours before or after surgery.  No Smoking including e-cigarettes for 24 hours before surgery.  No chewable tobacco products for at least 6 hours before surgery.  No nicotine patches on the day of surgery.  Do not use any recreational drugs for at least a week (preferably 2 weeks) before your surgery.  Please be advised that the combination of cocaine and anesthesia may have negative outcomes, up to and including death. If you test positive  for cocaine, your surgery will be cancelled.  On the morning of surgery brush your teeth with toothpaste and water, you may rinse your mouth with mouthwash if you wish. Do not swallow any toothpaste or mouthwash.  Use CHG Soap or wipes as directed on instruction sheet.  Do not wear jewelry, make-up, hairpins, clips or nail polish.  For welded (permanent) jewelry: bracelets, anklets, waist bands, etc.  Please have this removed prior to surgery.  If it is not removed, there is a chance that hospital personnel will need to cut it off on the day of surgery.  Do not wear lotions, powders, or perfumes.   Do not shave body hair from the neck down 48 hours before surgery.  Contact lenses, hearing aids and dentures may not be worn into surgery.  Do not bring valuables to the hospital. [facility] is not responsible for any missing/lost belongings or valuables.   Notify your doctor if there is any change in your medical condition (cold, fever, infection).  Wear comfortable clothing (specific to your surgery type) to the hospital.  After surgery, you can help prevent lung complications by doing breathing exercises.  Take deep breaths and cough every 1-2 hours. Your doctor may order a device called an Incentive Spirometer to help you take deep breaths.  When coughing or sneezing, hold a pillow firmly against your incision with both hands. This is called splinting. Doing this helps protect your incision. It also decreases belly discomfort.  If  you are being admitted to the hospital overnight, leave your suitcase in the car. After surgery it may be brought to your room.  In case of increased patient census, it may be necessary for you, the patient, to continue your postoperative care in the Same Day Surgery department.  If you are being discharged the day of surgery, you will not be allowed to drive home. You will need a responsible individual to drive you home and stay with you for 24 hours  after surgery.   If you are taking public transportation, you will need to have a responsible individual with you.  Please call the Pre-admissions Testing Dept. at [phone] if you have any questions about these instructions.  Surgery Visitation Policy:  Patients having surgery or a procedure may have two visitors.  Children under the age of 13 must have an adult with them who is not the patient.  Inpatient Visitation:    Visiting hours are 7 a.m. to 8 p.m. Up to four visitors are allowed at one time in a patient room. The visitors may rotate out with other people during the day.  One visitor age 68 or older may stay with the patient overnight and must be in the room by 8 p.m.   Merchandiser, Retail to address health-related social needs:  [email or web address]                                                                                                            Preparing for Surgery with CHLORHEXIDINE GLUCONATE (CHG) Soap  Chlorhexidine Gluconate (CHG) Soap  o An antiseptic cleaner that kills germs and bonds with the skin to continue killing germs even after washing  o Used for showering the night before surgery and morning of surgery  Before surgery, you can play an important role by reducing the number of germs on your skin.  CHG (Chlorhexidine gluconate) soap is an antiseptic cleanser which kills germs and bonds with the skin to continue killing germs even after washing.  Please do not use if you have an allergy to CHG or antibacterial soaps. If your skin becomes reddened/irritated stop using the CHG.  1. Shower the NIGHT BEFORE SURGERY with CHG soap.  2. If you choose to wash your hair, wash your hair first as usual with your normal shampoo.  3. After shampooing, rinse your hair and body thoroughly to remove the shampoo.  4. Use CHG as you would any other liquid soap. You can apply CHG directly to the skin and wash gently with a clean  washcloth.  5. Apply the CHG soap to your body only from the neck down. Do not use on open wounds or open sores. Avoid contact with your eyes, ears, mouth, and genitals (private parts). Wash face and genitals (private parts) with your normal soap.  6. Wash thoroughly, paying special attention to the area where your surgery will be performed.  7. Thoroughly rinse your body with warm water.  8. Do not shower/wash with your normal soap after using and rinsing off  the CHG soap.  9. Do not use lotions, oils, etc., after showering with CHG.  10. Pat yourself dry with a clean towel.  11. Wear clean pajamas to bed the night before surgery.  12. Place clean sheets on your bed the night of your shower and do not sleep with pets.  13. Do not apply any deodorants/lotions/powders.  14. Please wear clean clothes to the hospital.  15. Remember to brush your teeth with your regular toothpaste.                                                                                                           Preparing for Surgery with CHLORHEXIDINE GLUCONATE (CHG) Soap  Chlorhexidine Gluconate (CHG) Soap  o An antiseptic cleaner that kills germs and bonds with the skin to continue killing germs even after washing  o Used for showering the night before surgery and morning of surgery  Before surgery, you can play an important role by reducing the number of germs on your skin.  CHG (Chlorhexidine gluconate) soap is an antiseptic cleanser which kills germs and bonds with the skin to continue killing germs even after washing.  Please do not use if you have an allergy to CHG or antibacterial soaps. If your skin becomes reddened/irritated stop using the CHG.  1. Shower the NIGHT BEFORE SURGERY with CHG soap.  2. If you choose to wash your hair, wash your hair first as usual with your normal shampoo.  3. After shampooing, rinse your hair and body thoroughly to remove the shampoo.  4. Use CHG as you would  any other liquid soap. You can apply CHG directly to the skin and wash gently with a clean washcloth.  5. Apply the CHG soap to your body only from the neck down. Do not use on open wounds or open sores. Avoid contact with your eyes, ears, mouth, and genitals (private parts). Wash face and genitals (private parts) with your normal soap.  6. Wash thoroughly, paying special attention to the area where your surgery will be performed.  7. Thoroughly rinse your body with warm water.  8. Do not shower/wash with your normal soap after using and rinsing off the CHG soap.  9. Do not use lotions, oils, etc., after showering with CHG.  10. Pat yourself dry with a clean towel.  11. Wear clean pajamas to bed the night before surgery.  12. Place clean sheets on your bed the night of your shower and do not sleep with pets.  13. Do not apply any deodorants/lotions/powders.  14. Please wear clean clothes to the hospital.  15. Remember to brush your teeth with your regular toothpaste.  Preparing for Surgery with CHLORHEXIDINE GLUCONATE (CHG) Soap  Chlorhexidine Gluconate (CHG) Soap  o An antiseptic cleaner that kills germs and bonds with the skin to continue killing germs even after washing  o Used for showering the night before surgery and morning of surgery  Before surgery, you can play an important role by reducing the number of germs on your skin.  CHG (Chlorhexidine gluconate) soap is an antiseptic cleanser which kills germs and bonds with the skin to continue killing germs even after washing.  Please do not use if you have an allergy to CHG or antibacterial soaps. If your skin becomes reddened/irritated stop using the CHG.  1. Shower the NIGHT BEFORE SURGERY with CHG soap.  2. If you choose to wash your hair, wash your hair first as usual with your normal shampoo.  3. After  shampooing, rinse your hair and body thoroughly to remove the shampoo.  4. Use CHG as you would any other liquid soap. You can apply CHG directly to the skin and wash gently with a clean washcloth.  5. Apply the CHG soap to your body only from the neck down. Do not use on open wounds or open sores. Avoid contact with your eyes, ears, mouth, and genitals (private parts). Wash face and genitals (private parts) with your normal soap.  6. Wash thoroughly, paying special attention to the area where your surgery will be performed.  7. Thoroughly rinse your body with warm water.  8. Do not shower/wash with your normal soap after using and rinsing off the CHG soap.  9. Do not use lotions, oils, etc., after showering with CHG.  10. Pat yourself dry with a clean towel.  11. Wear clean pajamas to bed the night before surgery.  12. Place clean sheets on your bed the night of your shower and do not sleep with pets.  13. Do not apply any deodorants/lotions/powders.  14. Please wear clean clothes to the hospital.  15. Remember to brush your teeth with your regular toothpaste.

## 2024-04-09 ENCOUNTER — Encounter: Admission: RE | Disposition: A | Payer: Self-pay | Source: Home / Self Care | Attending: Surgery

## 2024-04-09 ENCOUNTER — Ambulatory Visit: Admitting: Certified Registered"

## 2024-04-09 ENCOUNTER — Other Ambulatory Visit: Payer: Self-pay

## 2024-04-09 ENCOUNTER — Ambulatory Visit
Admission: RE | Admit: 2024-04-09 | Discharge: 2024-04-09 | Disposition: A | Discharge status: Home / Self Care | Payer: Self-pay | Attending: Surgery | Admitting: Surgery

## 2024-04-09 ENCOUNTER — Encounter: Payer: Self-pay | Admitting: Surgery

## 2024-04-09 DIAGNOSIS — Z87891 Personal history of nicotine dependence: Secondary | ICD-10-CM | POA: Diagnosis not present

## 2024-04-09 DIAGNOSIS — K429 Umbilical hernia without obstruction or gangrene: Secondary | ICD-10-CM | POA: Insufficient documentation

## 2024-04-09 MED ORDER — PROPOFOL 10 MG/ML IV BOLUS
INTRAVENOUS | Status: DC | PRN
  Administered 2024-04-09: 180 mg via INTRAVENOUS

## 2024-04-09 MED ORDER — EPHEDRINE SULFATE-NACL 50-0.9 MG/10ML-% IV SOSY
PREFILLED_SYRINGE | INTRAVENOUS | Status: DC | PRN
  Administered 2024-04-09 (×4): 5 mg via INTRAVENOUS

## 2024-04-09 MED ORDER — DEXMEDETOMIDINE HCL IN NACL 80 MCG/20ML IV SOLN
INTRAVENOUS | Status: DC | PRN
  Administered 2024-04-09: 8 ug via INTRAVENOUS

## 2024-04-09 MED ORDER — PROPOFOL 10 MG/ML IV BOLUS
INTRAVENOUS | Status: AC
  Filled 2024-04-09: qty 40

## 2024-04-09 MED ORDER — BUPIVACAINE-EPINEPHRINE (PF) 0.5% -1:200000 IJ SOLN
INTRAMUSCULAR | Status: DC | PRN
  Administered 2024-04-09: 30 mL via PERINEURAL

## 2024-04-09 MED ORDER — ROCURONIUM BROMIDE 100 MG/10ML IV SOLN
INTRAVENOUS | Status: DC | PRN
  Administered 2024-04-09: 50 mg via INTRAVENOUS

## 2024-04-09 MED ORDER — LIDOCAINE HCL (CARDIAC) PF 100 MG/5ML IV SOSY
PREFILLED_SYRINGE | INTRAVENOUS | Status: DC | PRN
  Administered 2024-04-09: 100 mg via INTRAVENOUS

## 2024-04-09 MED ORDER — OXYCODONE HCL 5 MG/5ML PO SOLN: 5.0000 mg | Freq: Once | ORAL | Status: DC | PRN

## 2024-04-09 MED ORDER — MIDAZOLAM HCL 2 MG/2ML IJ SOLN
INTRAMUSCULAR | Status: AC
  Filled 2024-04-09: qty 2

## 2024-04-09 MED ORDER — CELECOXIB 200 MG PO CAPS
200.0000 mg | ORAL_CAPSULE | ORAL | Status: AC
  Administered 2024-04-09: 200 mg via ORAL

## 2024-04-09 MED ORDER — IBUPROFEN 800 MG PO TABS
800.0000 mg | ORAL_TABLET | Freq: Three times a day (TID) | ORAL | 0 refills | Status: AC | PRN
  Filled 2024-04-09: qty 30, 10d supply, fill #0

## 2024-04-09 MED ORDER — DEXAMETHASONE SOD PHOSPHATE PF 10 MG/ML IJ SOLN
INTRAMUSCULAR | Status: DC | PRN
  Administered 2024-04-09: 10 mg via INTRAVENOUS

## 2024-04-09 MED ORDER — ONDANSETRON HCL 4 MG/2ML IJ SOLN: 4.0000 mg | Freq: Once | INTRAMUSCULAR | Status: DC | PRN

## 2024-04-09 MED ORDER — CELECOXIB 200 MG PO CAPS
ORAL_CAPSULE | ORAL | Status: AC
  Filled 2024-04-09: qty 1

## 2024-04-09 MED ORDER — CHLORHEXIDINE GLUCONATE CLOTH 2 % EX PADS
6.0000 | MEDICATED_PAD | Freq: Once | CUTANEOUS | Status: AC
  Administered 2024-04-09: 6 via TOPICAL

## 2024-04-09 MED ORDER — FENTANYL CITRATE (PF) 100 MCG/2ML IJ SOLN
INTRAMUSCULAR | Status: AC
  Filled 2024-04-09: qty 2

## 2024-04-09 MED ORDER — MIDAZOLAM HCL (PF) 2 MG/2ML IJ SOLN
INTRAMUSCULAR | Status: DC | PRN
  Administered 2024-04-09: 2 mg via INTRAVENOUS

## 2024-04-09 MED ORDER — OXYCODONE-ACETAMINOPHEN 5-325 MG PO TABS
1.0000 | ORAL_TABLET | Freq: Three times a day (TID) | ORAL | 0 refills | Status: AC | PRN
  Filled 2024-04-09: qty 6, 2d supply, fill #0

## 2024-04-09 MED ORDER — LACTATED RINGERS IV SOLN: INTRAVENOUS | Status: DC

## 2024-04-09 MED ORDER — ACETAMINOPHEN 325 MG PO TABS
650.0000 mg | ORAL_TABLET | Freq: Three times a day (TID) | ORAL | 0 refills | Status: AC | PRN
  Filled 2024-04-09: qty 40, 7d supply, fill #0

## 2024-04-09 MED ORDER — GABAPENTIN 300 MG PO CAPS
300.0000 mg | ORAL_CAPSULE | ORAL | Status: AC
  Administered 2024-04-09: 300 mg via ORAL

## 2024-04-09 MED ORDER — CHLORHEXIDINE GLUCONATE 0.12 % MT SOLN
15.0000 mL | Freq: Once | OROMUCOSAL | Status: AC
  Administered 2024-04-09: 15 mL via OROMUCOSAL

## 2024-04-09 MED ORDER — CHLORHEXIDINE GLUCONATE 0.12 % MT SOLN
OROMUCOSAL | Status: AC
  Filled 2024-04-09: qty 15

## 2024-04-09 MED ORDER — CEFAZOLIN SODIUM-DEXTROSE 2-4 GM/100ML-% IV SOLN
2.0000 g | INTRAVENOUS | Status: AC
  Administered 2024-04-09: 2 g via INTRAVENOUS

## 2024-04-09 MED ORDER — ORAL CARE MOUTH RINSE: 15.0000 mL | Freq: Once | OROMUCOSAL | Status: AC

## 2024-04-09 MED ORDER — 0.9 % SODIUM CHLORIDE (POUR BTL) OPTIME
TOPICAL | Status: DC | PRN
  Administered 2024-04-09: 500 mL

## 2024-04-09 MED ORDER — ACETAMINOPHEN 10 MG/ML IV SOLN: 1000.0000 mg | Freq: Once | INTRAVENOUS | Status: DC | PRN

## 2024-04-09 MED ORDER — ACETAMINOPHEN 500 MG PO TABS
1000.0000 mg | ORAL_TABLET | ORAL | Status: AC
  Administered 2024-04-09: 1000 mg via ORAL

## 2024-04-09 MED ORDER — BUPIVACAINE-EPINEPHRINE (PF) 0.5% -1:200000 IJ SOLN
INTRAMUSCULAR | Status: AC
  Filled 2024-04-09: qty 30

## 2024-04-09 MED ORDER — DOCUSATE SODIUM 100 MG PO CAPS
100.0000 mg | ORAL_CAPSULE | Freq: Two times a day (BID) | ORAL | 0 refills | Status: AC | PRN
  Filled 2024-04-09: qty 20, 10d supply, fill #0

## 2024-04-09 MED ORDER — GABAPENTIN 300 MG PO CAPS
ORAL_CAPSULE | ORAL | Status: AC
  Filled 2024-04-09: qty 1

## 2024-04-09 MED ORDER — ONDANSETRON HCL 4 MG/2ML IJ SOLN
INTRAMUSCULAR | Status: DC | PRN
  Administered 2024-04-09: 4 mg via INTRAVENOUS

## 2024-04-09 MED ORDER — OXYCODONE HCL 5 MG PO TABS: 5.0000 mg | ORAL_TABLET | Freq: Once | ORAL | Status: DC | PRN

## 2024-04-09 MED ORDER — FENTANYL CITRATE (PF) 100 MCG/2ML IJ SOLN: 25.0000 ug | INTRAMUSCULAR | Status: DC | PRN

## 2024-04-09 MED ORDER — CEFAZOLIN SODIUM-DEXTROSE 2-4 GM/100ML-% IV SOLN
INTRAVENOUS | Status: AC
  Filled 2024-04-09: qty 100

## 2024-04-09 MED ORDER — ACETAMINOPHEN 500 MG PO TABS
ORAL_TABLET | ORAL | Status: AC
  Filled 2024-04-09: qty 2

## 2024-04-09 MED ORDER — SUGAMMADEX SODIUM 200 MG/2ML IV SOLN
INTRAVENOUS | Status: DC | PRN
  Administered 2024-04-09: 200 mg via INTRAVENOUS

## 2024-04-09 NOTE — Anesthesia Postprocedure Evaluation (Signed)
"   Anesthesia Post Note  Patient: CHUE BERKOVICH III  Procedure(s) Performed: REPAIR, HERNIA, UMBILICAL, ADULT (Abdomen) INSERTION OF MESH  Patient location during evaluation: PACU Anesthesia Type: General Level of consciousness: awake and alert Pain management: pain level controlled Vital Signs Assessment: post-procedure vital signs reviewed and stable Respiratory status: spontaneous breathing, nonlabored ventilation, respiratory function stable and patient connected to nasal cannula oxygen Cardiovascular status: blood pressure returned to baseline and stable Postop Assessment: no apparent nausea or vomiting Anesthetic complications: no   No notable events documented.   Last Vitals:  Vitals:   04/09/24 0833 04/09/24 0845  BP: 122/71 121/66  Pulse: 85 73  Resp: 20 13  Temp: (!) 36.1 C   SpO2: 100% 96%    Last Pain:  Vitals:   04/09/24 0833  TempSrc:   PainSc: 0-No pain                 Fairy A Manoj Enriquez      "

## 2024-04-09 NOTE — H&P (Signed)
 Subjective:    CC: Umbilical hernia without obstruction and without gangrene [K42.9]   HPI: Subjective Jermaine Ray is a 54 y.o. male who returns for evaluation of above. Symptoms were first noted several years ago. Some discomfort with pressure in area. Reducible.   Returns to attempt scheduling again. No changes since last visit   Past Medical History:  has a past medical history of Chickenpox, Depression, Hyperlipidemia, IBS (irritable bowel syndrome), and Migraines.   Past Surgical History:  Past Surgical History       Past Surgical History:  Procedure Laterality Date   COLONOSCOPY   08/21/2021    H/O Colonic Polyps/Repeat 5 years/TKT   HYDROCELE EXCISION / REPAIR            Family History: family history includes ALS (age of onset: 92) in his sister; Alcohol abuse in his maternal grandfather; Cancer in his paternal grandfather and paternal grandmother; Diabetes type II in his father; Hyperlipidemia (Elevated cholesterol) in his father; Myocardial Infarction (Heart attack) (age of onset: 40) in his father; Osteoporosis (Thinning of bones) in his maternal grandfather and maternal grandmother; Stroke in his maternal grandmother.   Social History:  reports that he quit smoking about 9 years ago. His smoking use included cigarettes. He has never used smokeless tobacco. He reports current alcohol use. He reports that he does not use drugs.   Current Medications: has a current medication list which includes the following prescription(s): ferrous sulfate, furosemide, multivitamin, polyethylene glycol, terbinafine hcl, and ascorbic acid (vitamin c).   Allergies:     Allergies as of 02/24/2024   (No Known Allergies)      ROS:  A 15 point review of systems was performed and pertinent positives and negatives noted in HPI   Objective:    Objective BP (!) 141/85   Pulse 80   Ht 177.8 cm (5' 10)   Wt 89.8 kg (198 lb)   BMI 28.41 kg/m    Constitutional :  Alert,  cooperative, no distress  Lymphatics/Throat:  Supple, no lymphadenopathy  Respiratory:  clear to auscultation bilaterally  Cardiovascular:  regular rate and rhythm  Gastrointestinal: soft, non-tender; bowel sounds normal; no masses,  no organomegaly. umbilical hernia noted.  small and reducible  Musculoskeletal: Steady gait and movement  Skin: Cool and moist  Psychiatric: Normal affect, non-agitated, not confused         LABS:  N/a    RADS: N/a Assessment:    Assessment Umbilical hernia without obstruction and without gangrene [K42.9]   Plan:    Plan 1. Umbilical hernia without obstruction and without gangrene [K42.9]   Discussed the risk of surgery including recurrence, which can be up to 50% in the case of incisional or complex hernias, possible use of prosthetic materials (mesh) and the increased risk of mesh infxn if used, bleeding, chronic pain, post-op infxn, post-op SBO or ileus, and possible re-operation to address said risks. The risks of general anesthetic, if used, includes MI, CVA, sudden death or even reaction to anesthetic medications also discussed. Alternatives include continued observation.  Benefits include possible symptom relief, prevention of incarceration, strangulation, enlargement in size over time, and the risk of emergency surgery in the face of strangulation.    Typical post-op recovery time of 3-5 days with 2 weeks of activity restrictions were also discussed.   ED return precautions given for sudden increase in pain, size of hernia with accompanying fever, nausea, and/or vomiting.   The patient verbalized understanding and all questions  were answered to the patient's satisfaction.     2. Patient has elected to proceed with surgical treatment. Procedure will be scheduled. Open due to size with mesh.  Off work one week, likely three weeks of lifting restrictions.   labs/images/medications/previous chart entries reviewed personally and relevant  changes/updates noted above.

## 2024-04-09 NOTE — Anesthesia Preprocedure Evaluation (Signed)
"                                    Anesthesia Evaluation  Patient identified by MRN, date of birth, ID band Patient awake    Reviewed: Allergy & Precautions, H&P , NPO status , Patient's Chart, lab work & pertinent test results  Airway Mallampati: II  TM Distance: >3 FB Neck ROM: Full    Dental no notable dental hx.    Pulmonary neg pulmonary ROS, Patient abstained from smoking., former smoker   Pulmonary exam normal breath sounds clear to auscultation       Cardiovascular negative cardio ROS Normal cardiovascular exam Rhythm:Regular Rate:Normal     Neuro/Psych negative neurological ROS  negative psych ROS   GI/Hepatic negative GI ROS, Neg liver ROS,,,  Endo/Other  negative endocrine ROS    Renal/GU negative Renal ROS  negative genitourinary   Musculoskeletal negative musculoskeletal ROS (+)    Abdominal   Peds negative pediatric ROS (+)  Hematology negative hematology ROS (+)   Anesthesia Other Findings   Reproductive/Obstetrics negative OB ROS                              Anesthesia Physical Anesthesia Plan  ASA: 2  Anesthesia Plan: General   Post-op Pain Management:    Induction: Intravenous  PONV Risk Score and Plan:   Airway Management Planned: Oral ETT  Additional Equipment:   Intra-op Plan:   Post-operative Plan: Extubation in OR  Informed Consent: I have reviewed the patients History and Physical, chart, labs and discussed the procedure including the risks, benefits and alternatives for the proposed anesthesia with the patient or authorized representative who has indicated his/her understanding and acceptance.     Dental advisory given  Plan Discussed with: CRNA  Anesthesia Plan Comments:         Anesthesia Quick Evaluation  "

## 2024-04-09 NOTE — Op Note (Signed)
 Preoperative diagnosis: umbilical hernia, initial, reducible Postoperative diagnosis: same  Procedure:  Open umbilical hernia repair with mesh  Anesthesia: LMA  Surgeon: Henriette Pierre  Wound Classification: Clean  Specimen: none  Complications: None  Estimated Blood Loss: minimal  Indications:see HPI  Findings: 2 cm x 2 cm initial, reducible umbilical hernia 2. Tension free repair achieved with mesh and suture 3. Adequate hemostasis  Description of procedure: The patient was brought to the operating room and general anesthesia was induced. A time-out was completed verifying correct patient, procedure, site, positioning, and implant(s) and/or special equipment prior to beginning this procedure. Antibiotics were administered prior to making the incision. SCDs placed. The anterior abdominal wall was prepped and draped in the standard sterile fashion.   An infraumbilical incision was made after infusing the preplanned incision with half percent Marcaine .  Dissection carried down to fascia where the umbilical stalk was noted.  The stalk was transected and there was noted to be a 2 cm x 2 cm umbilical hernia.  The preperitoneal fat contents were dissected off the surrounding structures and reduced.  Hemostasis was confirmed prior to reducing the actual contents. Due to size of hernia, decision was made to proceed with a mesh repair.    A 6.4 cm umbilical mesh was placed within the preperitoneal cavity through the present defect and secured in place on the abdominal wall using interrupted 0 Ethibond sutures.  Once the mesh was noted to be laying flat and secured to the abdominal wall the defect itself was primary closed using 0 Ethibond in a interrupted fashion.  The fascia as well as the skin incision was then infused with local.  After confirming hemostasis, the umbilical stalk was reattached to the abdominal wall using 2-0 Vicryl and the wound was irrigated and closed in a multilayer fashion,  using 3-0 Vicryl for the deep dermal layer in an interrupted fashion and running 4-0 Monocryl in a subcuticular fashion.  Wound was then dressed with Dermabond.  Patient was then successfully awakened and transferred to PACU in stable condition.  At the end of the procedure sponge and instrument counts were correct

## 2024-04-09 NOTE — Transfer of Care (Signed)
 Immediate Anesthesia Transfer of Care Note  Patient: Jermaine Ray  Procedure(s) Performed: REPAIR, HERNIA, UMBILICAL, ADULT (Abdomen) INSERTION OF MESH  Patient Location: PACU  Anesthesia Type:General  Level of Consciousness: drowsy and patient cooperative  Airway & Oxygen Therapy: Patient Spontanous Breathing and Patient connected to face mask oxygen  Post-op Assessment: Report given to RN, Post -op Vital signs reviewed and stable, and Patient moving all extremities X 4  Post vital signs: Reviewed and stable  Last Vitals:  Vitals Value Taken Time  BP 122/71 04/09/24 08:33  Temp    Pulse 76 04/09/24 08:36  Resp 11 04/09/24 08:36  SpO2 100 % 04/09/24 08:36  Vitals shown include unfiled device data.  Last Pain:  Vitals:   04/09/24 0623  TempSrc: Temporal  PainSc: 0-No pain         Complications: No notable events documented.

## 2024-04-09 NOTE — Discharge Instructions (Signed)
 Hernia repair, Care After ?This sheet gives you information about how to care for yourself after your procedure. Your health care provider may also give you more specific instructions. If you have problems or questions, contact your health care provider. ?What can I expect after the procedure? ?After your procedure, it is common to have the following: ?Pain in your abdomen, especially in the incision areas. You will be given medicine to control the pain. ?Tiredness. This is a normal part of the recovery process. Your energy level will return to normal over the next several weeks. ?Changes in your bowel movements, such as constipation or needing to go more often. Talk with your health care provider about how to manage this. ?Follow these instructions at home: ?Medicines ? tylenol and advil as needed for discomfort.  Please alternate between the two every four hours as needed for pain.   ? Use narcotics, if prescribed, only when tylenol and motrin is not enough to control pain. ? 325-650mg  every 8hrs to max of 3000mg /24hrs (including the 325mg  in every norco dose) for the tylenol.   ? Advil up to 800mg  per dose every 8hrs as needed for pain.   ?PLEASE RECORD NUMBER OF PILLS TAKEN UNTIL NEXT FOLLOW UP APPT.  THIS WILL HELP DETERMINE HOW READY YOU ARE TO BE RELEASED FROM ANY ACTIVITY RESTRICTIONS ?Do not drive or use heavy machinery while taking prescription pain medicine. ?Do not drink alcohol while taking prescription pain medicine. ? ?Incision care ? ?  ?Follow instructions from your health care provider about how to take care of your incision areas. Make sure you: ?Keep your incisions clean and dry. ?Wash your hands with soap and water before and after applying medicine to the areas, and before and after changing your bandage (dressing). If soap and water are not available, use hand sanitizer. ?Change your dressing as told by your health care provider. ?Leave stitches (sutures), skin glue, or adhesive strips in  place. These skin closures may need to stay in place for 2 weeks or longer. If adhesive strip edges start to loosen and curl up, you may trim the loose edges. Do not remove adhesive strips completely unless your health care provider tells you to do that. ?Do not wear tight clothing over the incisions. Tight clothing may rub and irritate the incision areas, which may cause the incisions to open. ?Do not take baths, swim, or use a hot tub until your health care provider approves. OK TO SHOWER IN 24HRS.   ?Check your incision area every day for signs of infection. Check for: ?More redness, swelling, or pain. ?More fluid or blood. ?Warmth. ?Pus or a bad smell. ?Activity ?Avoid lifting anything that is heavier than 10 lb (4.5 kg) for 2 weeks or until your health care provider says it is okay. ?No pushing/pulling greater than 30lbs ?You may resume normal activities as told by your health care provider. Ask your health care provider what activities are safe for you. ?Take rest breaks during the day as needed. ?Eating and drinking ?Follow instructions from your health care provider about what you can eat after surgery. ?To prevent or treat constipation while you are taking prescription pain medicine, your health care provider may recommend that you: ?Drink enough fluid to keep your urine clear or pale yellow. ?Take over-the-counter or prescription medicines. ?Eat foods that are high in fiber, such as fresh fruits and vegetables, whole grains, and beans. ?Limit foods that are high in fat and processed sugars, such as fried and  sweet foods. ?General instructions ?Ask your health care provider when you will need an appointment to get your sutures or staples removed. ?Keep all follow-up visits as told by your health care provider. This is important. ?Contact a health care provider if: ?You have more redness, swelling, or pain around your incisions. ?You have more fluid or blood coming from the incisions. ?Your incisions feel  warm to the touch. ?You have pus or a bad smell coming from your incisions or your dressing. ?You have a fever. ?You have an incision that breaks open (edges not staying together) after sutures or staples have been removed. ?You develop a rash. ?You have chest pain or difficulty breathing. ?You have pain or swelling in your legs. ?You feel light-headed or you faint. ?Your abdomen swells (becomes distended). ?You have nausea or vomiting. ?You have blood in your stool (feces). ?This information is not intended to replace advice given to you by your health care provider. Make sure you discuss any questions you have with your health care provider. ?Document Released: 09/15/2004 Document Revised: 11/15/2017 Document Reviewed: 11/28/2015 ?[employer] Interactive Patient Education ? 2019 [employer] ?  ? ?

## 2024-04-09 NOTE — Anesthesia Procedure Notes (Signed)
 Procedure Name: Intubation Date/Time: 04/09/2024 7:36 AM  Performed by: Lennie Lamarr HERO, CRNAPre-anesthesia Checklist: Patient identified, Emergency Drugs available, Suction available and Patient being monitored Patient Re-evaluated:Patient Re-evaluated prior to induction Oxygen Delivery Method: Circle System Utilized Preoxygenation: Pre-oxygenation with 100% oxygen Induction Type: IV induction Ventilation: Mask ventilation without difficulty Laryngoscope Size: McGrath and 4 Grade View: Grade I Tube type: Oral Tube size: 7.5 mm Number of attempts: 1 Airway Equipment and Method: Stylet and Oral airway Placement Confirmation: ETT inserted through vocal cords under direct vision, positive ETCO2 and breath sounds checked- equal and bilateral Secured at: 23 cm Tube secured with: Tape Dental Injury: Teeth and Oropharynx as per pre-operative assessment

## 2024-04-09 NOTE — Interval H&P Note (Signed)
 No change. OK to proceed.

## 2024-04-10 ENCOUNTER — Encounter: Payer: Self-pay | Admitting: Surgery
